# Patient Record
Sex: Female | Born: 1965 | Race: White | Hispanic: No | Smoking: Former smoker
Health system: Southern US, Community
[De-identification: ages and names within clinical notes are randomized; demographics above are authoritative.]

## PROBLEM LIST (undated history)

## (undated) DIAGNOSIS — I10 Essential (primary) hypertension: Secondary | ICD-10-CM

## (undated) DIAGNOSIS — E781 Pure hyperglyceridemia: Secondary | ICD-10-CM

## (undated) DIAGNOSIS — R0602 Shortness of breath: Secondary | ICD-10-CM

## (undated) DIAGNOSIS — J302 Other seasonal allergic rhinitis: Secondary | ICD-10-CM

## (undated) DIAGNOSIS — Z800 Family history of malignant neoplasm of digestive organs: Secondary | ICD-10-CM

## (undated) DIAGNOSIS — Z9851 Tubal ligation status: Secondary | ICD-10-CM

## (undated) DIAGNOSIS — J189 Pneumonia, unspecified organism: Secondary | ICD-10-CM

## (undated) DIAGNOSIS — J45909 Unspecified asthma, uncomplicated: Secondary | ICD-10-CM

## (undated) DIAGNOSIS — J40 Bronchitis, not specified as acute or chronic: Secondary | ICD-10-CM

## (undated) DIAGNOSIS — N959 Unspecified menopausal and perimenopausal disorder: Secondary | ICD-10-CM

## (undated) HISTORY — DX: Family history of malignant neoplasm of digestive organs: Z80.0

## (undated) HISTORY — PX: CHOLECYSTECTOMY: SHX55

## (undated) HISTORY — DX: Shortness of breath: R06.02

## (undated) HISTORY — DX: Pneumonia, unspecified organism: J18.9

## (undated) HISTORY — DX: Bronchitis, not specified as acute or chronic: J40

## (undated) HISTORY — DX: Pure hyperglyceridemia: E78.1

## (undated) HISTORY — PX: TUBAL LIGATION: SHX77

## (undated) HISTORY — DX: Unspecified menopausal and perimenopausal disorder: N95.9

## (undated) HISTORY — DX: Other seasonal allergic rhinitis: J30.2

## (undated) NOTE — *Deleted (*Deleted)
Pt  was called to discuss her lab results.

---
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
## 1999-05-19 ENCOUNTER — Ambulatory Visit (HOSPITAL_COMMUNITY): Admission: RE | Admit: 1999-05-19 | Discharge: 1999-05-19 | Payer: Self-pay | Admitting: *Deleted

## 1999-06-24 ENCOUNTER — Ambulatory Visit (HOSPITAL_COMMUNITY): Admission: RE | Admit: 1999-06-24 | Discharge: 1999-06-24 | Payer: Self-pay | Admitting: *Deleted

## 1999-09-03 ENCOUNTER — Inpatient Hospital Stay (HOSPITAL_COMMUNITY): Admission: AD | Admit: 1999-09-03 | Discharge: 1999-09-03 | Payer: Self-pay | Admitting: Obstetrics

## 1999-09-29 ENCOUNTER — Ambulatory Visit (HOSPITAL_COMMUNITY): Admission: RE | Admit: 1999-09-29 | Discharge: 1999-09-29 | Payer: Self-pay | Admitting: Obstetrics & Gynecology

## 1999-11-11 ENCOUNTER — Encounter (HOSPITAL_COMMUNITY): Admission: RE | Admit: 1999-11-11 | Discharge: 1999-11-23 | Payer: Self-pay | Admitting: *Deleted

## 1999-11-22 ENCOUNTER — Inpatient Hospital Stay (HOSPITAL_COMMUNITY): Admission: AD | Admit: 1999-11-22 | Discharge: 1999-11-26 | Payer: Self-pay | Admitting: *Deleted

## 2001-05-22 ENCOUNTER — Ambulatory Visit (HOSPITAL_COMMUNITY): Admission: RE | Admit: 2001-05-22 | Discharge: 2001-05-22 | Payer: Self-pay | Admitting: *Deleted

## 2001-07-08 ENCOUNTER — Ambulatory Visit (HOSPITAL_COMMUNITY): Admission: RE | Admit: 2001-07-08 | Discharge: 2001-07-08 | Payer: Self-pay | Admitting: *Deleted

## 2001-09-03 ENCOUNTER — Inpatient Hospital Stay (HOSPITAL_COMMUNITY): Admission: AD | Admit: 2001-09-03 | Discharge: 2001-09-03 | Payer: Self-pay | Admitting: *Deleted

## 2001-09-10 ENCOUNTER — Inpatient Hospital Stay (HOSPITAL_COMMUNITY): Admission: AD | Admit: 2001-09-10 | Discharge: 2001-09-10 | Payer: Self-pay | Admitting: *Deleted

## 2001-09-10 ENCOUNTER — Ambulatory Visit (HOSPITAL_COMMUNITY): Admission: RE | Admit: 2001-09-10 | Discharge: 2001-09-10 | Payer: Self-pay | Admitting: *Deleted

## 2001-09-17 ENCOUNTER — Inpatient Hospital Stay (HOSPITAL_COMMUNITY): Admission: AD | Admit: 2001-09-17 | Discharge: 2001-09-17 | Payer: Self-pay | Admitting: *Deleted

## 2001-09-26 ENCOUNTER — Encounter (HOSPITAL_COMMUNITY): Admission: AD | Admit: 2001-09-26 | Discharge: 2001-10-21 | Payer: Self-pay | Admitting: Obstetrics & Gynecology

## 2001-09-29 ENCOUNTER — Inpatient Hospital Stay (HOSPITAL_COMMUNITY): Admission: AD | Admit: 2001-09-29 | Discharge: 2001-09-29 | Payer: Self-pay | Admitting: Obstetrics & Gynecology

## 2001-10-02 ENCOUNTER — Inpatient Hospital Stay (HOSPITAL_COMMUNITY): Admission: AD | Admit: 2001-10-02 | Discharge: 2001-10-02 | Payer: Self-pay | Admitting: Obstetrics

## 2001-10-18 ENCOUNTER — Inpatient Hospital Stay (HOSPITAL_COMMUNITY): Admission: AD | Admit: 2001-10-18 | Discharge: 2001-10-21 | Payer: Self-pay | Admitting: Obstetrics & Gynecology

## 2001-10-18 ENCOUNTER — Encounter (INDEPENDENT_AMBULATORY_CARE_PROVIDER_SITE_OTHER): Payer: Self-pay

## 2005-06-26 ENCOUNTER — Emergency Department (HOSPITAL_COMMUNITY): Admission: EM | Admit: 2005-06-26 | Discharge: 2005-06-27 | Payer: Self-pay | Admitting: Emergency Medicine

## 2005-12-23 ENCOUNTER — Emergency Department (HOSPITAL_COMMUNITY): Admission: EM | Admit: 2005-12-23 | Discharge: 2005-12-23 | Payer: Self-pay | Admitting: Emergency Medicine

## 2006-01-23 ENCOUNTER — Ambulatory Visit (HOSPITAL_COMMUNITY): Admission: RE | Admit: 2006-01-23 | Discharge: 2006-01-23 | Payer: Self-pay | Admitting: Obstetrics

## 2007-12-30 ENCOUNTER — Emergency Department (HOSPITAL_COMMUNITY): Admission: EM | Admit: 2007-12-30 | Discharge: 2007-12-30 | Payer: Self-pay | Admitting: Family Medicine

## 2010-11-26 ENCOUNTER — Emergency Department (HOSPITAL_COMMUNITY)
Admission: EM | Admit: 2010-11-26 | Discharge: 2010-11-26 | Payer: Self-pay | Source: Home / Self Care | Admitting: Emergency Medicine

## 2010-12-17 ENCOUNTER — Encounter: Payer: Self-pay | Admitting: Obstetrics

## 2010-12-18 ENCOUNTER — Encounter: Payer: Self-pay | Admitting: Obstetrics

## 2011-02-06 LAB — COMPREHENSIVE METABOLIC PANEL
ALT: 20 U/L (ref 0–35)
AST: 19 U/L (ref 0–37)
CO2: 26 mEq/L (ref 19–32)
Calcium: 9 mg/dL (ref 8.4–10.5)
Creatinine, Ser: 0.68 mg/dL (ref 0.4–1.2)
GFR calc Af Amer: >60 mL/min (ref >60)
GFR calc non Af Amer: >60 mL/min (ref >60)
Glucose, Bld: 89 mg/dL (ref 70–99)
Sodium: 140 mEq/L (ref 135–145)
Total Protein: 7 g/dL (ref 6.0–8.3)

## 2011-02-06 LAB — LIPASE, BLOOD: Lipase: 24 U/L (ref 11–59)

## 2011-02-06 LAB — URINALYSIS, ROUTINE W REFLEX MICROSCOPIC
Bilirubin Urine: NEGATIVE
Ketones, ur: NEGATIVE mg/dL
Nitrite: NEGATIVE
Protein, ur: NEGATIVE mg/dL

## 2011-02-06 LAB — CBC
Hemoglobin: 13 g/dL (ref 12.0–15.0)
MCH: 29.5 pg (ref 26.0–34.0)
MCHC: 32.4 g/dL (ref 30.0–36.0)
RDW: 13.6 % (ref 11.5–15.5)

## 2011-04-14 NOTE — Op Note (Signed)
[facility]  Patient:    WILHELMENA, ZEA Visit Number: [id number] MRN: [id number]          Service Type: Attending:  Conni Elliot, M.D. Dictated by:   Conni Elliot, M.D. Proc. Date: 10/19/01                             Operative Report  PREOPERATIVE DIAGNOSIS:       _____ sterilization.  POSTOPERATIVE DIAGNOSIS:      _____ sterilization.  OPERATION:                    Modified bilateral Pomeroy bilateral tubal ligation.  SURGEON:                      Conni Elliot, M.D.  ANESTHESIA:                   Epidural.  FINDINGS:                     Tubes and ovaries normal.  _____ .  DESCRIPTION OF PROCEDURE:     The patient was placed _____ , epidural anesthetic.  The patient was supine, left lateral _____ oxygen and was prepped and draped in the sterile fashion.  A periumbilical incision was made and incision made in the skin down to the fascia.  _____ .  The left fallopian tube was identified, grasped with Babcock clamps, followed out to the fimbriated end.  _____ was brought into the operative field, was suture ligated approximately 0.5 to 2 cm segment _____ .  The same procedure was done on the opposite side.  Hemostasis was adequate.  The anterior peritoneum and fascia.  Subcutaneous tissue and skin were closed in the usual fashion. Estimated blood loss was _____ cc.  Needle and sponge counts correct.  Dictated by:   Conni Elliot, M.D. Attending:  Conni Elliot, M.D. DD:  10/19/01 TD:  10/20/01 Job: [id number]

## 2011-04-14 NOTE — Op Note (Signed)
[facility]  Patient:    Kathy Hamilton, Kathy Hamilton Visit Number: [id number] MRN: [id number]          Service Type: ANT Location: [facility] Attending Physician:  Antionette Char Dictated by:   Conni Elliot, M.D. Proc. Date: 10/19/01 Admit Date:  09/26/2001 Discharge Date: 10/21/2001                             Operative Report  PREOPERATIVE DIAGNOSIS:       Desire for surgical sterilization.  POSTOPERATIVE DIAGNOSIS:      Desire for surgical sterilization.  OPERATION:                    Modified bilateral Pomeroy bilateral tubal                               ligation.  SURGEON:                      Conni Elliot, M.D.  ANESTHESIA:                   Epidural/local.  FINDINGS:                     Tubes and ovaries normal post gravid state.  DESCRIPTION OF PROCEDURE:     The patient was placed on the OR table and and underwent epidural anesthetic.  The patient was supine, left lateral tilt position, receiving oxygen and was prepped and draped in a sterile fashion.  A periumbilical incision was made in the skin and deepened down to the fascia.  The peritoneal cavity was entered.  The left fallopian tube was identified, grasped with Babcock clamps, followed out to the fimbriated end. A knuckle of tube was brought up into the operative field, that was suture ligated and an approximate 0.5 to 2 cm segment was excised. Hemostasis was adequate.  The same procedure was done on the opposite side. Hemostasis was adequate.  The anterior peritoneum and fascia, subcutaneous tissue and skin were closed in the usual fashion. Estimated blood loss was less than 10 cc.  Needle and sponge counts correct.  The patient tolerated the procedure well. Dictated by:   Conni Elliot, M.D. Attending Physician:  Antionette Char DD:  10/19/01 TD:  10/20/01 Job: [id number]

## 2011-06-15 ENCOUNTER — Other Ambulatory Visit (HOSPITAL_COMMUNITY): Payer: Self-pay | Admitting: Obstetrics

## 2011-06-15 DIAGNOSIS — Z1231 Encounter for screening mammogram for malignant neoplasm of breast: Secondary | ICD-10-CM

## 2011-07-10 ENCOUNTER — Inpatient Hospital Stay (INDEPENDENT_AMBULATORY_CARE_PROVIDER_SITE_OTHER)
Admission: RE | Admit: 2011-07-10 | Discharge: 2011-07-10 | Disposition: A | Discharge status: Home / Self Care | Payer: 59 | Source: Ambulatory Visit | Attending: Family Medicine | Admitting: Family Medicine

## 2011-07-10 DIAGNOSIS — B029 Zoster without complications: Secondary | ICD-10-CM

## 2011-07-24 ENCOUNTER — Ambulatory Visit (HOSPITAL_COMMUNITY)
Admission: RE | Admit: 2011-07-24 | Discharge: 2011-07-24 | Disposition: A | Discharge status: Home / Self Care | Payer: 59 | Source: Ambulatory Visit | Attending: Obstetrics | Admitting: Obstetrics

## 2011-07-24 DIAGNOSIS — Z1231 Encounter for screening mammogram for malignant neoplasm of breast: Secondary | ICD-10-CM

## 2011-07-25 ENCOUNTER — Ambulatory Visit (HOSPITAL_COMMUNITY): Payer: 59

## 2011-11-28 HISTORY — PX: ORTHOPEDIC SURGERY: SHX850

## 2012-08-20 ENCOUNTER — Emergency Department (HOSPITAL_COMMUNITY)
Admission: EM | Admit: 2012-08-20 | Discharge: 2012-08-20 | Disposition: A | Discharge status: Home / Self Care | Payer: No Typology Code available for payment source | Attending: Emergency Medicine | Admitting: Emergency Medicine

## 2012-08-20 ENCOUNTER — Encounter (HOSPITAL_COMMUNITY): Payer: Self-pay

## 2012-08-20 DIAGNOSIS — Z87891 Personal history of nicotine dependence: Secondary | ICD-10-CM | POA: Insufficient documentation

## 2012-08-20 DIAGNOSIS — Y92410 Unspecified street and highway as the place of occurrence of the external cause: Secondary | ICD-10-CM | POA: Insufficient documentation

## 2012-08-20 DIAGNOSIS — M25519 Pain in unspecified shoulder: Secondary | ICD-10-CM | POA: Insufficient documentation
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
## 2012-08-20 NOTE — ED Provider Notes (Signed)
Medical screening examination/treatment/procedure(s) were performed by non-physician practitioner and as supervising physician I was immediately available for consultation/collaboration.    Lisel Siegrist R [employer], MD 08/20/12 1658 

## 2012-10-11 ENCOUNTER — Emergency Department (HOSPITAL_COMMUNITY): Payer: Self-pay

## 2012-10-11 ENCOUNTER — Emergency Department (HOSPITAL_COMMUNITY)
Admission: EM | Admit: 2012-10-11 | Discharge: 2012-10-12 | Disposition: A | Discharge status: Home / Self Care | Payer: Self-pay | Attending: Emergency Medicine | Admitting: Emergency Medicine

## 2012-10-11 DIAGNOSIS — W268XXA Contact with other sharp object(s), not elsewhere classified, initial encounter: Secondary | ICD-10-CM | POA: Insufficient documentation

## 2012-10-11 DIAGNOSIS — Y939 Activity, unspecified: Secondary | ICD-10-CM | POA: Insufficient documentation

## 2012-10-11 DIAGNOSIS — S90851A Superficial foreign body, right foot, initial encounter: Secondary | ICD-10-CM

## 2012-10-11 DIAGNOSIS — Z87891 Personal history of nicotine dependence: Secondary | ICD-10-CM | POA: Insufficient documentation

## 2012-10-11 DIAGNOSIS — IMO0002 Reserved for concepts with insufficient information to code with codable children: Secondary | ICD-10-CM | POA: Insufficient documentation

## 2012-10-11 DIAGNOSIS — Y929 Unspecified place or not applicable: Secondary | ICD-10-CM | POA: Insufficient documentation

## 2012-10-11 NOTE — ED Notes (Signed)
Pt stepped on a piece of glass with R foot about a month ago. Pt states there is glass on the lateral side of R foot. Pt ambulatory with steady gait at triage.

## 2012-10-11 NOTE — ED Notes (Signed)
Pt returned from xray ambulatory

## 2012-10-12 MED ORDER — HYDROCODONE-ACETAMINOPHEN 5-325 MG PO TABS: 1.0000 | ORAL_TABLET | ORAL | Status: DC | PRN

## 2012-10-12 NOTE — ED Provider Notes (Signed)
History     CSN: [id number]  Arrival date & time 10/11/12  2139   First MD Initiated Contact with Patient 10/11/12 2250      Chief Complaint  Patient presents with  . Foriegn Body to foot     (Consider location/radiation/quality/duration/timing/severity/associated sxs/prior treatment) Patient is a 46 y.o. female presenting with lower extremity pain. The history is provided by the patient.  Foot Pain This is a new problem. The current episode started 1 to 4 weeks ago. The problem occurs constantly. The problem has been unchanged. Pertinent negatives include no fever. Associated symptoms comments: She stepped on a piece of glass 2 weeks ago and has continued pain. No redness, or fever. No significant swelling.Marland Kitchen    No past medical history on file.  Past Surgical History  Procedure Date  . Cholecystectomy     No family history on file.  History  Substance Use Topics  . Smoking status: Former Games developer  . Smokeless tobacco: Not on file  . Alcohol Use: No    OB History    Grav Para Term Preterm Abortions TAB SAB Ect Mult Living                  Review of Systems  Constitutional: Negative for fever.  Musculoskeletal:       See HPI.  Skin: Negative for wound.    Allergies  Review of patient's allergies indicates no known allergies.  Home Medications   Current Outpatient Rx  Name  Route  Sig  Dispense  Refill  . ACETAMINOPHEN 500 MG PO TABS   Oral   Take 500 mg by mouth every 6 (six) hours as needed. For pain         . ADULT MULTIVITAMIN W/MINERALS CH   Oral   Take 1 tablet by mouth daily.         Marland Kitchen PRESCRIPTION MEDICATION   Oral   Take 1-2 tablets by mouth every 8 (eight) hours as needed. Prescription medication taken as needed for muscle pain or spasm           BP 141/91  Pulse 84  Temp 98.7 F (37.1 C) (Oral)  Resp 17  SpO2 98%  Physical Exam  Constitutional: She appears well-developed and well-nourished. No distress.  Musculoskeletal:     Small firm nodule palpable plantar right foot overlying distal 5th MT. No wound, no redness.   Skin: Skin is warm and dry.    ED Course  Procedures (including critical care time)  Labs Reviewed - No data to display Dg Foot Complete Right  10/11/2012  *RADIOLOGY REPORT*  Clinical Data: Foreign body? Stepped on glass.  RIGHT FOOT COMPLETE - 3+ VIEW  Comparison:  None.  Findings:  There is no evidence of fracture or dislocation.  There is no evidence of arthropathy or other focal bone abnormality. Soft tissues are unremarkable.  IMPRESSION: Negative.   Original Report Authenticated By: Davonna Belling, M.D.      No diagnosis found. 1. Foreign body right foot.   MDM  Questionable foreign body in right foot. Refer to surgery.        Rodena Medin, PA-C 10/12/12 0010

## 2012-10-12 NOTE — ED Provider Notes (Signed)
Medical screening examination/treatment/procedure(s) were performed by non-physician practitioner and as supervising physician I was immediately available for consultation/collaboration.  Jashad Depaula T Teryn Boerema, MD 10/12/12 0624 

## 2012-12-02 ENCOUNTER — Ambulatory Visit (INDEPENDENT_AMBULATORY_CARE_PROVIDER_SITE_OTHER): Payer: Managed Care, Other (non HMO) | Admitting: Emergency Medicine

## 2012-12-02 VITALS — BP 151/99 | HR 80 | Temp 98.5°F | Resp 16 | Ht 63.0 in | Wt 189.0 lb

## 2012-12-02 DIAGNOSIS — N949 Unspecified condition associated with female genital organs and menstrual cycle: Secondary | ICD-10-CM

## 2012-12-02 DIAGNOSIS — N938 Other specified abnormal uterine and vaginal bleeding: Secondary | ICD-10-CM

## 2012-12-02 LAB — POCT CBC
HCT, POC: 49.2 % — AB (ref 37.7–47.9)
Lymph, poc: 3.8 — AB (ref 0.6–3.4)
MCH, POC: 29.3 pg (ref 27–31.2)
MCHC: 31.3 g/dL — AB (ref 31.8–35.4)
MCV: 93.7 fL (ref 80–97)
MID (cbc): 0.7 (ref 0–0.9)
POC LYMPH PERCENT: 29 %L (ref 10–50)
Platelet Count, POC: 425 10*3/uL — AB (ref 142–424)
RDW, POC: 13.9 %
WBC: 13 10*3/uL — AB (ref 4.6–10.2)

## 2012-12-02 LAB — POCT UA - MICROSCOPIC ONLY
Casts, Ur, LPF, POC: NEGATIVE
WBC, Ur, HPF, POC: NEGATIVE
Yeast, UA: NEGATIVE

## 2012-12-02 LAB — POCT URINALYSIS DIPSTICK
Leukocytes, UA: NEGATIVE
Nitrite, UA: NEGATIVE
Protein, UA: 30
pH, UA: 5

## 2012-12-02 LAB — POCT URINE PREGNANCY: Preg Test, Ur: NEGATIVE

## 2012-12-02 NOTE — Patient Instructions (Addendum)
Dysfunctional Uterine Bleeding  Normally, menstrual periods begin between ages 11 to 17 in young women. A normal menstrual cycle/period may begin every 23 days up to 35 days and lasts from 1 to 7 days. Around 12 to 14 days before your menstrual period starts, ovulation (ovary produces an egg) occurs. When counting the time between menstrual periods, count from the first day of bleeding of the previous period to the first day of bleeding of the next period.  Dysfunctional (abnormal) uterine bleeding is bleeding that is different from a normal menstrual period. Your periods may come earlier or later than usual. They may be lighter, have blood clots or be heavier. You may have bleeding between periods, or you may skip one period or more. You may have bleeding after sexual intercourse, bleeding after menopause, or no menstrual period.  CAUSES   · Pregnancy (normal, miscarriage, tubal).  · IUDs (intrauterine device, birth control).  · Birth control pills.  · Hormone treatment.  · Menopause.  · Infection of the cervix.  · Blood clotting problems.  · Infection of the inside lining of the uterus.  · Endometriosis, inside lining of the uterus growing in the pelvis and other female organs.  · Adhesions (scar tissue) inside the uterus.  · Obesity or severe weight loss.  · Uterine polyps inside the uterus.  · Cancer of the vagina, cervix, or uterus.  · Ovarian cysts or polycystic ovary syndrome.  · Medical problems (diabetes, thyroid disease).  · Uterine fibroids (noncancerous tumor).  · Problems with your female hormones.  · Endometrial hyperplasia, very thick lining and enlarged cells inside of the uterus.  · Medicines that interfere with ovulation.  · Radiation to the pelvis or abdomen.  · Chemotherapy.  DIAGNOSIS   · Your doctor will discuss the history of your menstrual periods, medicines you are taking, changes in your weight, stress in your life, and any medical problems you may have.  · Your doctor will do a physical  and pelvic examination.  · Your doctor may want to perform certain tests to make a diagnosis, such as:  · Pap test.  · Blood tests.  · Cultures for infection.  · CT scan.  · Ultrasound.  · Hysteroscopy.  · Laparoscopy.  · MRI.  · Hysterosalpingography.  · D and C.  · Endometrial biopsy.  TREATMENT   Treatment will depend on the cause of the dysfunctional uterine bleeding (DUB). Treatment may include:  · Observing your menstrual periods for a couple of months.  · Prescribing medicines for medical problems, including:  · Antibiotics.  · Hormones.  · Birth control pills.  · Removing an IUD (intrauterine device, birth control).  · Surgery:  · D and C (scrape and remove tissue from inside the uterus).  · Laparoscopy (examine inside the abdomen with a lighted tube).  · Uterine ablation (destroy lining of the uterus with electrical current, laser, heat, or freezing).  · Hysteroscopy (examine cervix and uterus with a lighted tube).  · Hysterectomy (remove the uterus).  HOME CARE INSTRUCTIONS   · If medicines were prescribed, take exactly as directed. Do not change or switch medicines without consulting your caregiver.  · Long term heavy bleeding may result in iron deficiency. Your caregiver may have prescribed iron pills. They help replace the iron that your body lost from heavy bleeding. Take exactly as directed.  · Do not take aspirin or medicines that contain aspirin one week before or during your menstrual period. Aspirin may make   the bleeding worse.  · If you need to change your sanitary pad or tampon more than once every 2 hours, stay in bed with your feet elevated and a cold pack on your lower abdomen. Rest as much as possible, until the bleeding stops or slows down.  · Eat well-balanced meals. Eat foods high in iron. Examples are:  · Leafy green vegetables.  · Whole-grain breads and cereals.  · Eggs.  · Meat.  · Liver.  · Do not try to lose weight until the abnormal bleeding has stopped and your blood iron level is  back to normal. Do not lift more than ten pounds or do strenuous activities when you are bleeding.  · For a couple of months, make note on your calendar, marking the start and ending of your period, and the type of bleeding (light, medium, heavy, spotting, clots or missed periods). This is for your caregiver to better evaluate your problem.  SEEK MEDICAL CARE IF:   · You develop nausea (feeling sick to your stomach) and vomiting, dizziness, or diarrhea while you are taking your medicine.  · You are getting lightheaded or weak.  · You have any problems that may be related to the medicine you are taking.  · You develop pain with your DUB.  · You want to remove your IUD.  · You want to stop or change your birth control pills or hormones.  · You have any type of abnormal bleeding mentioned above.  · You are over 16 years old and have not had a menstrual period yet.  · You are 47 years old and you are still having menstrual periods.  · You have any of the symptoms mentioned above.  · You develop a rash.  SEEK IMMEDIATE MEDICAL CARE IF:   · An oral temperature above 102° F (38.9° C) develops.  · You develop chills.  · You are changing your sanitary pad or tampon more than once an hour.  · You develop abdominal pain.  · You pass out or faint.  Document Released: 11/10/2000 Document Revised: 02/05/2012 Document Reviewed: 10/12/2009  Exit[employer]® Patient Information ©2013 [employer].

## 2012-12-02 NOTE — Progress Notes (Signed)
[facility] [address], [place] [address] [phone]  Date:  12/02/2012   Name:  Kathy Hamilton   DOB:  [birth year]   MRN:  [id number]  PCP:  No primary provider on file.    Chief Complaint: Menorrhagia and Gynecologic Exam   History of Present Illness:  Kathy Hamilton is a 47 y.o. very pleasant female patient who presents with the following:  Normal regular menses in October and November.  Had bleeding at usual time in December and stopped 12/22 and resumed the next day when she passed tissue and a large clot.  Continues to bleed daily and uses 5 pads a day.  No history of dysfunctional bleeding.   Premenopausal.  No clotting disorder.  [id number].  There is no problem list on file for this patient.   History reviewed. No pertinent past medical history.  Past Surgical History  Procedure Date  . Cholecystectomy     History  Substance Use Topics  . Smoking status: Former Games developer  . Smokeless tobacco: Not on file  . Alcohol Use: No    Family History  Problem Relation Age of Onset  . Cancer Mother   . Stroke Mother   . Heart attack Mother   . Bipolar disorder Sister   . Stroke Brother   . Asthma Daughter   . Bipolar disorder Daughter   . ADD / ADHD Son   . Asperger's syndrome Son     No Known Allergies  Medication list has been reviewed and updated.  Current Outpatient Prescriptions on File Prior to Visit  Medication Sig Dispense Refill  . Multiple Vitamin (MULTIVITAMIN WITH MINERALS) TABS Take 1 tablet by mouth daily.      Marland Kitchen acetaminophen (TYLENOL) 500 MG tablet Take 500 mg by mouth every 6 (six) hours as needed. For pain      . HYDROcodone-acetaminophen (NORCO/VICODIN) 5-325 MG per tablet Take 1 tablet by mouth every 4 (four) hours as needed for pain.  10 tablet  0  . PRESCRIPTION MEDICATION Take 1-2 tablets by mouth every 8 (eight) hours as needed. Prescription medication taken as needed for muscle pain or spasm        Review of Systems:  As  per HPI, otherwise negative.    Physical Examination: Filed Vitals:   12/02/12 1539  BP: 151/99  Pulse: 80  Temp: 98.5 F (36.9 C)  Resp: 16   Filed Vitals:   12/02/12 1539  Height: 5\' 3"  (1.6 m)  Weight: 189 lb (85.73 kg)   Body mass index is 33.48 kg/(m^2). Ideal Body Weight: Weight in (lb) to have BMI = 25: 140.8   GEN: WDWN, NAD, Non-toxic, A & O x 3 HEENT: Atraumatic, Normocephalic. Neck supple. No masses, No LAD. Ears and Nose: No external deformity. CV: RRR, No M/G/R. No JVD. No thrill. No extra heart sounds. PULM: CTA B, no wheezes, crackles, rhonchi. No retractions. No resp. distress. No accessory muscle use. ABD: S, NT, ND, +BS. No rebound. No HSM. EXTR: No c/c/e NEURO Normal gait.  PSYCH: Normally interactive. Conversant. Not depressed or anxious appearing.  Calm demeanor.   Pelvic - normal external genitalia, vulva, vagina, cervix, uterus and adnexa   Assessment and Plan: Dysfunctional uterine bleeding. GYN consult  Carmelina Dane, MD  Results for orders placed in visit on 12/02/12  POCT URINE PREGNANCY      Component Value Range   Preg Test, Ur Negative    POCT URINALYSIS DIPSTICK  Component Value Range   Color, UA brown     Clarity, UA cloudy     Glucose, UA neg     Bilirubin, UA neg     Ketones, UA neg     Spec Grav, UA >=1.030     Blood, UA large     pH, UA 5.0     Protein, UA 30     Urobilinogen, UA 0.2     Nitrite, UA neg     Leukocytes, UA Negative    POCT UA - MICROSCOPIC ONLY      Component Value Range   WBC, Ur, HPF, POC neg     RBC, urine, microscopic tntc     Bacteria, U Microscopic neg     Mucus, UA neg     Epithelial cells, urine per micros 7-12     Crystals, Ur, HPF, POC neg     Casts, Ur, LPF, POC neg     Yeast, UA neg    POCT CBC      Component Value Range   WBC 13.0 (*) 4.6 - 10.2 K/uL   Lymph, poc 3.8 (*) 0.6 - 3.4   POC LYMPH PERCENT 29.0  10 - 50 %L   MID (cbc) 0.7  0 - 0.9   POC MID % 5.7  0 - 12 %M    POC Granulocyte 8.5 (*) 2 - 6.9   Granulocyte percent 65.3  37 - 80 %G   RBC 5.25  4.04 - 5.48 M/uL   Hemoglobin 15.4  12.2 - 16.2 g/dL   HCT, POC 40.9 (*) 81.1 - 47.9 %   MCV 93.7  80 - 97 fL   MCH, POC 29.3  27 - 31.2 pg   MCHC 31.3 (*) 31.8 - 35.4 g/dL   RDW, POC 91.4     Platelet Count, POC 425 (*) 142 - 424 K/uL   MPV 8.8  0 - 99.8 fL

## 2014-11-19 ENCOUNTER — Emergency Department (HOSPITAL_COMMUNITY)
Admission: EM | Admit: 2014-11-19 | Discharge: 2014-11-19 | Disposition: A | Discharge status: Home / Self Care | Payer: Medicaid Other | Attending: Emergency Medicine | Admitting: Emergency Medicine

## 2014-11-19 ENCOUNTER — Emergency Department (HOSPITAL_COMMUNITY): Payer: Medicaid Other

## 2014-11-19 ENCOUNTER — Encounter (HOSPITAL_COMMUNITY): Payer: Self-pay | Admitting: Emergency Medicine

## 2014-11-19 DIAGNOSIS — R42 Dizziness and giddiness: Secondary | ICD-10-CM | POA: Insufficient documentation

## 2014-11-19 DIAGNOSIS — Z79899 Other long term (current) drug therapy: Secondary | ICD-10-CM | POA: Insufficient documentation

## 2014-11-19 DIAGNOSIS — J159 Unspecified bacterial pneumonia: Secondary | ICD-10-CM | POA: Insufficient documentation

## 2014-11-19 DIAGNOSIS — J189 Pneumonia, unspecified organism: Secondary | ICD-10-CM

## 2014-11-19 DIAGNOSIS — R05 Cough: Secondary | ICD-10-CM | POA: Diagnosis present

## 2014-11-19 DIAGNOSIS — Z87891 Personal history of nicotine dependence: Secondary | ICD-10-CM | POA: Diagnosis not present

## 2014-11-19 LAB — RAPID STREP SCREEN (MED CTR MEBANE ONLY): STREPTOCOCCUS, GROUP A SCREEN (DIRECT): NEGATIVE

## 2014-11-19 MED ORDER — HYDROCODONE-HOMATROPINE 5-1.5 MG/5ML PO SYRP: 5.0000 mL | ORAL_SOLUTION | Freq: Four times a day (QID) | ORAL | Status: DC | PRN

## 2014-11-19 MED ORDER — AZITHROMYCIN 250 MG PO TABS: ORAL_TABLET | ORAL | Status: DC

## 2014-11-19 MED ORDER — ALBUTEROL SULFATE HFA 108 (90 BASE) MCG/ACT IN AERS
1.0000 | INHALATION_SPRAY | RESPIRATORY_TRACT | Status: DC
Start: 2014-11-19 — End: 2014-11-19
  Administered 2014-11-19: 1 via RESPIRATORY_TRACT
  Filled 2014-11-19: qty 6.7

## 2014-11-19 NOTE — ED Notes (Signed)
Per pt, states cold symptoms for over three days-OTC meds not working

## 2014-11-19 NOTE — ED Provider Notes (Signed)
CSN: [id number]     Arrival date & time 11/19/14  1200 History   First MD Initiated Contact with Patient 11/19/14 1231    This chart was scribed for non-physician practitioner, Arthor CaptainAbigail Aissa Lisowski, working with Richardean Canalavid H Yao, MD by Marica OtterNusrat Rahman, ED Scribe. This patient was seen in room WTR5/WTR5 and the patient's care was started at 12:34 PM.  Chief Complaint  Patient presents with  . URI   HPI  PCP: No primary care provider on file. HPI Comments: Kathy Hamilton is a 48 y.o. female who presents to the Emergency Department complaining of cold like Sx including acute, intermittent cough, elevated BP, wheezing, sleep disturbances (due to cough), and dizziness onset three days ago. Pt reports taking DayQuil, Alkaseltzer and Advil with minimal relief. Pt denies fever; tobacco use; or Hx of asthma.    History reviewed. No pertinent past medical history. Past Surgical History  Procedure Laterality Date  . Cholecystectomy     Family History  Problem Relation Age of Onset  . Cancer Mother   . Stroke Mother   . Heart attack Mother   . Bipolar disorder Sister   . Stroke Brother   . Asthma Daughter   . Bipolar disorder Daughter   . ADD / ADHD Son   . Asperger's syndrome Son    History  Substance Use Topics  . Smoking status: Former Games developermoker  . Smokeless tobacco: Not on file  . Alcohol Use: No   OB History    No data available     Review of Systems  Constitutional: Negative for fever and chills.  Respiratory: Positive for cough and wheezing.   Neurological: Positive for dizziness.  Psychiatric/Behavioral: Negative for confusion.  All other systems reviewed and are negative.  Allergies  Review of patient's allergies indicates no known allergies.  Home Medications   Prior to Admission medications   Medication Sig Start Date End Date Taking? Authorizing Provider  acetaminophen (TYLENOL) 500 MG tablet Take 500 mg by mouth every 6 (six) hours as needed. For pain    Historical Provider, MD   HYDROcodone-acetaminophen (NORCO/VICODIN) 5-325 MG per tablet Take 1 tablet by mouth every 4 (four) hours as needed for pain. 10/12/12   Shari A Upstill, PA-C  Multiple Vitamin (MULTIVITAMIN WITH MINERALS) TABS Take 1 tablet by mouth daily.    Historical Provider, MD  PRESCRIPTION MEDICATION Take 1-2 tablets by mouth every 8 (eight) hours as needed. Prescription medication taken as needed for muscle pain or spasm    Historical Provider, MD   Triage Vitals: BP 153/66 mmHg  Pulse 35  Temp(Src) 99 F (37.2 C) (Oral)  Resp 18  SpO2 98%  LMP 11/04/2014  Physical Exam Appears moderately ill but not toxic; temperature as noted in vitals. Ears normal. Eyes:glassy appearance, no discharge  Heart: RRR, NO M/G/R Throat and pharynx normal.   Neck supple. No adenopathyhy in the neck.  Sinuses non tender.  The chest is clear. Abdomen is soft and nontender  ED Course  Procedures (including critical care time) DIAGNOSTIC STUDIES: Oxygen Saturation is 98% on RA, nl by my interpretation.    COORDINATION OF CARE: 12:37 PM-Discussed treatment plan which includes imaging, labs, and meds (inhaler) with pt at bedside and pt agreed to plan.   Labs Review Labs Reviewed  RAPID STREP SCREEN  CULTURE, GROUP A STREP    Imaging Review No results found.   EKG Interpretation None      MDM   Final diagnoses:  CAP (community acquired  pneumonia)    Patient with dx of CAP on cxr. Albuterol, supportive treatment, and zpack. Low grade temp elevation, normal 02 sats. No active wheezing. Low risk and ok for d.c.  I personally performed the services described in this documentation, which was scribed in my presence. The recorded information has been reviewed and is accurate.       Filed Vitals:   11/19/14 1206  BP: 153/66  Pulse: 35  Temp: 99 F (37.2 C)  Resp: [address]     [name], PA-C 11/20/14 0723  Richardean Canalavid H Yao, MD 11/20/14 [phone]

## 2014-11-19 NOTE — Discharge Instructions (Signed)

## 2014-11-21 LAB — CULTURE, GROUP A STREP

## 2015-01-29 ENCOUNTER — Emergency Department (HOSPITAL_COMMUNITY)
Admission: EM | Admit: 2015-01-29 | Discharge: 2015-01-29 | Disposition: A | Discharge status: Home / Self Care | Payer: 59 | Source: Home / Self Care | Attending: Family Medicine | Admitting: Family Medicine

## 2015-01-29 ENCOUNTER — Encounter (HOSPITAL_COMMUNITY): Payer: Self-pay | Admitting: *Deleted

## 2015-01-29 DIAGNOSIS — J069 Acute upper respiratory infection, unspecified: Secondary | ICD-10-CM | POA: Diagnosis not present

## 2015-01-29 LAB — POCT RAPID STREP A: Streptococcus, Group A Screen (Direct): NEGATIVE

## 2015-01-29 MED ORDER — IPRATROPIUM BROMIDE 0.06 % NA SOLN: 2.0000 | Freq: Four times a day (QID) | NASAL | Status: DC

## 2015-01-29 NOTE — ED Provider Notes (Signed)
CSN: [id number]     Arrival date & time 01/29/15  1127 History   First MD Initiated Contact with Patient 01/29/15 1321     Chief Complaint  Patient presents with  . Sore Throat   (Consider location/radiation/quality/duration/timing/severity/associated sxs/prior Treatment) Patient is a 49 y.o. female presenting with pharyngitis.  Sore Throat This is a new problem. The current episode started more than 2 days ago. The problem has been gradually worsening. Pertinent negatives include no chest pain, no abdominal pain, no headaches and no shortness of breath. The symptoms are aggravated by swallowing.    History reviewed. No pertinent past medical history. Past Surgical History  Procedure Laterality Date  . Cholecystectomy     Family History  Problem Relation Age of Onset  . Cancer Mother   . Stroke Mother   . Heart attack Mother   . Bipolar disorder Sister   . Stroke Brother   . Asthma Daughter   . Bipolar disorder Daughter   . ADD / ADHD Son   . Asperger's syndrome Son    History  Substance Use Topics  . Smoking status: Former Games developer  . Smokeless tobacco: Not on file  . Alcohol Use: No   OB History    No data available     Review of Systems  Constitutional: Negative for fever and chills.  HENT: Positive for congestion, postnasal drip, rhinorrhea and sore throat.   Respiratory: Negative for cough, shortness of breath and wheezing.   Cardiovascular: Negative.  Negative for chest pain.  Gastrointestinal: Negative.  Negative for abdominal pain.  Genitourinary: Negative.   Neurological: Negative for headaches.    Allergies  Oxycodone  Home Medications   Prior to Admission medications   Medication Sig Start Date End Date Taking? Authorizing Provider  acetaminophen (TYLENOL) 500 MG tablet Take 500 mg by mouth every 6 (six) hours as needed. For pain    Historical Provider, MD  azithromycin (ZITHROMAX Z-PAK) 250 MG tablet 2 po day one, then 1 daily x 4 days 11/19/14    Arthor Captain, PA-C  HYDROcodone-acetaminophen (NORCO/VICODIN) 5-325 MG per tablet Take 1 tablet by mouth every 4 (four) hours as needed for pain. 10/12/12   Shari A Upstill, PA-C  HYDROcodone-homatropine (HYCODAN) 5-1.5 MG/5ML syrup Take 5 mLs by mouth every 6 (six) hours as needed for cough. 11/19/14   Arthor Captain, PA-C  ipratropium (ATROVENT) 0.06 % nasal spray Place 2 sprays into both nostrils 4 (four) times daily. 01/29/15   Linna Hoff, MD  Multiple Vitamin (MULTIVITAMIN WITH MINERALS) TABS Take 1 tablet by mouth daily.    Historical Provider, MD  PRESCRIPTION MEDICATION Take 1-2 tablets by mouth every 8 (eight) hours as needed. Prescription medication taken as needed for muscle pain or spasm    Historical Provider, MD   BP 132/87 mmHg  Pulse 75  Temp(Src) 98.1 F (36.7 C) (Oral)  Resp 16  SpO2 98%  LMP 01/08/2015 Physical Exam  Constitutional: She is oriented to person, place, and time. She appears well-developed and well-nourished. No distress.  HENT:  Head: Normocephalic.  Right Ear: External ear normal.  Left Ear: External ear normal.  Mouth/Throat: Oropharynx is clear and moist.  Eyes: Conjunctivae are normal. Pupils are equal, round, and reactive to light.  Neck: Normal range of motion. Neck supple.  Cardiovascular: Regular rhythm, normal heart sounds and intact distal pulses.   Pulmonary/Chest: Effort normal and breath sounds normal.  Lymphadenopathy:    She has cervical adenopathy.  Neurological: She is alert  and oriented to person, place, and time.  Skin: Skin is warm and dry.  Nursing note and vitals reviewed.   ED Course  Procedures (including critical care time) Labs Review Labs Reviewed  POCT RAPID STREP A ([facility]ARE ONLY)   Strep neg. Imaging Review No results found.   MDM   1. URI (upper respiratory infection)        Linna HoffJames D Slevin Gunby, MD 01/29/15 1410

## 2015-01-29 NOTE — Discharge Instructions (Signed)
Drink plenty of fluids as discussed, use medicine as prescribed, and mucinex or delsym for cough. Return or see your doctor if further problems °

## 2015-01-29 NOTE — ED Notes (Signed)
Pt  Reports      Symptoms     Of sorethroat  With  Pain  When  She  Swallows       Symptoms  X  2  Days

## 2015-02-01 LAB — CULTURE, GROUP A STREP: Strep A Culture: NEGATIVE

## 2015-02-11 ENCOUNTER — Ambulatory Visit: Payer: 59 | Admitting: Family Medicine

## 2015-03-04 ENCOUNTER — Encounter: Payer: Self-pay | Admitting: Family Medicine

## 2015-03-04 ENCOUNTER — Ambulatory Visit (INDEPENDENT_AMBULATORY_CARE_PROVIDER_SITE_OTHER): Payer: Medicaid Other | Admitting: Family Medicine

## 2015-03-04 VITALS — BP 134/74 | HR 79 | Temp 98.2°F | Resp 16 | Ht 63.0 in | Wt 190.0 lb

## 2015-03-04 DIAGNOSIS — Z23 Encounter for immunization: Secondary | ICD-10-CM

## 2015-03-04 DIAGNOSIS — Z0000 Encounter for general adult medical examination without abnormal findings: Secondary | ICD-10-CM

## 2015-03-04 LAB — COMPLETE METABOLIC PANEL WITH GFR
ALBUMIN: 4.4 g/dL (ref 3.5–5.2)
ALT: 17 U/L (ref 0–35)
AST: 17 U/L (ref 0–37)
Alkaline Phosphatase: 70 U/L (ref 39–117)
BUN: 17 mg/dL (ref 6–23)
CALCIUM: 9.2 mg/dL (ref 8.4–10.5)
CHLORIDE: 105 meq/L (ref 96–112)
CO2: 23 meq/L (ref 19–32)
CREATININE: 0.62 mg/dL (ref 0.50–1.10)
GFR, Est African American: >89 mL/min
GFR, Est Non African American: >89 mL/min
Glucose, Bld: 99 mg/dL (ref 70–99)
POTASSIUM: 4.1 meq/L (ref 3.5–5.3)
Sodium: 138 mEq/L (ref 135–145)
Total Bilirubin: 0.7 mg/dL (ref 0.2–1.2)
Total Protein: 7.4 g/dL (ref 6.0–8.3)

## 2015-03-04 LAB — CBC WITH DIFFERENTIAL/PLATELET
Basophils Absolute: 0 10*3/uL (ref 0.0–0.1)
Basophils Relative: 0 % (ref 0–1)
EOS PCT: 3 % (ref 0–5)
Eosinophils Absolute: 0.3 10*3/uL (ref 0.0–0.7)
HEMATOCRIT: 43.3 % (ref 36.0–46.0)
HEMOGLOBIN: 14.1 g/dL (ref 12.0–15.0)
LYMPHS ABS: 3.1 10*3/uL (ref 0.7–4.0)
LYMPHS PCT: 34 % (ref 12–46)
MCH: 29.1 pg (ref 26.0–34.0)
MCHC: 32.6 g/dL (ref 30.0–36.0)
MCV: 89.3 fL (ref 78.0–100.0)
MONO ABS: 0.5 10*3/uL (ref 0.1–1.0)
MONOS PCT: 6 % (ref 3–12)
MPV: 10.1 fL (ref 8.6–12.4)
Neutro Abs: 5.1 10*3/uL (ref 1.7–7.7)
Neutrophils Relative %: 57 % (ref 43–77)
Platelets: 367 10*3/uL (ref 150–400)
RBC: 4.85 MIL/uL (ref 3.87–5.11)
RDW: 14 % (ref 11.5–15.5)
WBC: 9 10*3/uL (ref 4.0–10.5)

## 2015-03-04 LAB — POCT URINALYSIS DIP (DEVICE)
BILIRUBIN URINE: NEGATIVE
GLUCOSE, UA: NEGATIVE mg/dL
KETONES UR: NEGATIVE mg/dL
Leukocytes, UA: NEGATIVE
NITRITE: NEGATIVE
Protein, ur: NEGATIVE mg/dL
Urobilinogen, UA: 0.2 mg/dL (ref 0.0–1.0)
pH: 5 (ref 5.0–8.0)

## 2015-03-04 LAB — TSH: TSH: 2.04 u[IU]/mL (ref 0.350–4.500)

## 2015-03-04 NOTE — Patient Instructions (Signed)
Health Maintenance Adopting a healthy lifestyle and getting preventive care can go a long way to promote health and wellness. Talk with your health care provider about what schedule of regular examinations is right for you. This is a good chance for you to check in with your provider about disease prevention and staying healthy. In between checkups, there are plenty of things you can do on your own. Experts have done a lot of research about which lifestyle changes and preventive measures are most likely to keep you healthy. Ask your health care provider for more information. WEIGHT AND DIET  Eat a healthy diet  Be sure to include plenty of vegetables, fruits, low-fat dairy products, and lean protein.  Do not eat a lot of foods high in solid fats, added sugars, or salt.  Get regular exercise. This is one of the most important things you can do for your health.  Most adults should exercise for at least 150 minutes each week. The exercise should increase your heart rate and make you sweat (moderate-intensity exercise).  Most adults should also do strengthening exercises at least twice a week. This is in addition to the moderate-intensity exercise.  Maintain a healthy weight  Body mass index (BMI) is a measurement that can be used to identify possible weight problems. It estimates body fat based on height and weight. Your health care provider can help determine your BMI and help you achieve or maintain a healthy weight.  For females 25 years of age and older:   A BMI below 18.5 is considered underweight.  A BMI of 18.5 to 24.9 is normal.  A BMI of 25 to 29.9 is considered overweight.  A BMI of 30 and above is considered obese.  Watch levels of cholesterol and blood lipids  You should start having your blood tested for lipids and cholesterol at 49 years of age, then have this test every 5 years.  You may need to have your cholesterol levels checked more often if:  Your lipid or  cholesterol levels are high.  You are older than 49 years of age.  You are at high risk for heart disease.  CANCER SCREENING   Lung Cancer  Lung cancer screening is recommended for adults 49-44 years old who are at high risk for lung cancer because of a history of smoking.  A yearly low-dose CT scan of the lungs is recommended for people who:  Currently smoke.  Have quit within the past 15 years.  Have at least a 30-pack-year history of smoking. A pack year is smoking an average of one pack of cigarettes a day for 1 year.  Yearly screening should continue until it has been 15 years since you quit.  Yearly screening should stop if you develop a health problem that would prevent you from having lung cancer treatment.  Breast Cancer  Practice breast self-awareness. This means understanding how your breasts normally appear and feel.  It also means doing regular breast self-exams. Let your health care provider know about any changes, no matter how small.  If you are in your 49s, you should have a clinical breast exam (CBE) by a health care provider every 1-3 years as part of a regular health exam.  If you are 49 or older, have a CBE every year. Also consider having a breast X-ray (mammogram) every year.  If you have a family history of breast cancer, talk to your health care provider about genetic screening.  If you are  at high risk for breast cancer, talk to your health care provider about having an MRI and a mammogram every year.  Breast cancer gene (BRCA) assessment is recommended for women who have family members with BRCA-related cancers. BRCA-related cancers include:  Breast.  Ovarian.  Tubal.  Peritoneal cancers.  Results of the assessment will determine the need for genetic counseling and BRCA1 and BRCA2 testing. Cervical Cancer Routine pelvic examinations to screen for cervical cancer are no longer recommended for nonpregnant women who are considered low  risk for cancer of the pelvic organs (ovaries, uterus, and vagina) and who do not have symptoms. A pelvic examination may be necessary if you have symptoms including those associated with pelvic infections. Ask your health care provider if a screening pelvic exam is right for you.   The Pap test is the screening test for cervical cancer for women who are considered at risk.  If you had a hysterectomy for a problem that was not cancer or a condition that could lead to cancer, then you no longer need Pap tests.  If you are older than 49 years, and you have had normal Pap tests for the past 10 years, you no longer need to have Pap tests.  If you have had past treatment for cervical cancer or a condition that could lead to cancer, you need Pap tests and screening for cancer for at least 20 years after your treatment.  If you no longer get a Pap test, assess your risk factors if they change (such as having a new sexual partner). This can affect whether you should start being screened again.  Some women have medical problems that increase their chance of getting cervical cancer. If this is the case for you, your health care provider may recommend more frequent screening and Pap tests.  The human papillomavirus (HPV) test is another test that may be used for cervical cancer screening. The HPV test looks for the virus that can cause cell changes in the cervix. The cells collected during the Pap test can be tested for HPV.  The HPV test can be used to screen women 30 years of age and older. Getting tested for HPV can extend the interval between normal Pap tests from three to five years.  An HPV test also should be used to screen women of any age who have unclear Pap test results.  After 49 years of age, women should have HPV testing as often as Pap tests.  Colorectal Cancer  This type of cancer can be detected and often prevented.  Routine colorectal cancer screening usually begins at 50 years of  age and continues through 49 years of age.  Your health care provider may recommend screening at an earlier age if you have risk factors for colon cancer.  Your health care provider may also recommend using home test kits to check for hidden blood in the stool.  A small camera at the end of a tube can be used to examine your colon directly (sigmoidoscopy or colonoscopy). This is done to check for the earliest forms of colorectal cancer.  Routine screening usually begins at age 50.  Direct examination of the colon should be repeated every 5-10 years through 49 years of age. However, you may need to be screened more often if early forms of precancerous polyps or small growths are found. Skin Cancer  Check your skin from head to toe regularly.  Tell your health care provider about any new moles or changes in   moles, especially if there is a change in a mole's shape or color.  Also tell your health care provider if you have a mole that is larger than the size of a pencil eraser.  Always use sunscreen. Apply sunscreen liberally and repeatedly throughout the day.  Protect yourself by wearing long sleeves, pants, a wide-brimmed hat, and sunglasses whenever you are outside. HEART DISEASE, DIABETES, AND HIGH BLOOD PRESSURE   Have your blood pressure checked at least every 1-2 years. High blood pressure causes heart disease and increases the risk of stroke.  If you are between 75 years and 42 years old, ask your health care provider if you should take aspirin to prevent strokes.  Have regular diabetes screenings. This involves taking a blood sample to check your fasting blood sugar level.  If you are at a normal weight and have a low risk for diabetes, have this test once every three years after 49 years of age.  If you are overweight and have a high risk for diabetes, consider being tested at a younger age or more often. PREVENTING INFECTION  Hepatitis B  If you have a higher risk for  hepatitis B, you should be screened for this virus. You are considered at high risk for hepatitis B if:  You were born in a country where hepatitis B is common. Ask your health care provider which countries are considered high risk.  Your parents were born in a high-risk country, and you have not been immunized against hepatitis B (hepatitis B vaccine).  You have HIV or AIDS.  You use needles to inject street drugs.  You live with someone who has hepatitis B.  You have had sex with someone who has hepatitis B.  You get hemodialysis treatment.  You take certain medicines for conditions, including cancer, organ transplantation, and autoimmune conditions. Hepatitis C  Blood testing is recommended for:  Everyone born from [birth year] through 1965.  Anyone with known risk factors for hepatitis C. Sexually transmitted infections (STIs)  You should be screened for sexually transmitted infections (STIs) including gonorrhea and chlamydia if:  You are sexually active and are younger than 48 years of age.  You are older than 49 years of age and your health care provider tells you that you are at risk for this type of infection.  Your sexual activity has changed since you were last screened and you are at an increased risk for chlamydia or gonorrhea. Ask your health care provider if you are at risk.  If you do not have HIV, but are at risk, it may be recommended that you take a prescription medicine daily to prevent HIV infection. This is called pre-exposure prophylaxis (PrEP). You are considered at risk if:  You are sexually active and do not regularly use condoms or know the HIV status of your partner(s).  You take drugs by injection.  You are sexually active with a partner who has HIV. Talk with your health care provider about whether you are at high risk of being infected with HIV. If you choose to begin PrEP, you should first be tested for HIV. You should then be tested every 3 months for  as long as you are taking PrEP.  PREGNANCY   If you are premenopausal and you may become pregnant, ask your health care provider about preconception counseling.  If you may become pregnant, take 400 to 800 micrograms (mcg) of folic acid every day.  If you want to prevent pregnancy, talk to your  partner who has HIV.  Talk with your health care provider about whether you are at high risk of being infected with HIV. If you choose to begin PrEP, you should first be tested for HIV. You should then be tested every 3 months for  as long as you are taking PrEP.   PREGNANCY   · If you are premenopausal and you may become pregnant, ask your health care provider about preconception counseling.  · If you may become pregnant, take 400 to 800 micrograms (mcg) of folic acid every day.  · If you want to prevent pregnancy, talk to your health care provider about birth control (contraception).  OSTEOPOROSIS AND MENOPAUSE   · Osteoporosis is a disease in which the bones lose minerals and strength with aging. This can result in serious bone fractures. Your risk for osteoporosis can be identified using a bone density scan.  · If you are 65 years of age or older, or if you are at risk for osteoporosis and fractures, ask your health care provider if you should be screened.  · Ask your health care provider whether you should take a calcium or vitamin D supplement to lower your risk for osteoporosis.  · Menopause may have certain physical symptoms and risks.  · Hormone replacement therapy may reduce some of these symptoms and risks.  Talk to your health care provider about whether hormone replacement therapy is right for you.   HOME CARE INSTRUCTIONS   · Schedule regular health, dental, and eye exams.  · Stay current with your immunizations.    · Do not use any tobacco products including cigarettes, chewing tobacco, or electronic cigarettes.  · If you are pregnant, do not drink alcohol.  · If you are breastfeeding, limit how much and how often you drink alcohol.  · Limit alcohol intake to no more than 1 drink per day for nonpregnant women. One drink equals 12 ounces of beer, 5 ounces of wine, or 1½ ounces of hard liquor.  · Do not use street drugs.  · Do not share needles.  · Ask your health care provider for help if you need support or information about quitting drugs.  · Tell your health care provider if you often feel depressed.  · Tell your health care provider if you have ever been abused or do not feel safe at home.  Document Released: 05/29/2011  Document Revised: 03/30/2014 Document Reviewed: 10/15/2013  [employer]® Patient Information ©2015 [employer]. This information is not intended to replace advice given to you by your health care provider. Make sure you discuss any questions you have with your health care provider.  Preventive Care for Adults  A healthy lifestyle and preventive care can promote health and wellness. Preventive health guidelines for women include the following key practices.  · A routine yearly physical is a good way to check with your health care provider about your health and preventive screening. It is a chance to share any concerns and updates on your health and to receive a thorough exam.  · Visit your dentist for a routine exam and preventive care every 6 months. Brush your teeth twice a day and floss once a day. Good oral hygiene prevents tooth decay and gum disease.  · The frequency of eye exams is based on your age, health, family medical history, use of contact lenses, and other factors. Follow your health care provider's recommendations for frequency of eye exams.  · Eat a healthy diet. Foods like vegetables, fruits, whole   grains, low-fat dairy products, and lean protein foods contain the nutrients you need without too many calories. Decrease your intake of foods high in solid fats, added sugars, and salt. Eat the right amount of calories for you. Get information about a proper diet from your health care provider, if necessary.  · Regular physical exercise is one of the most important things you can do for your health. Most adults should get at least 150 minutes of moderate-intensity exercise (any activity that increases your heart rate and causes you to sweat) each week. In addition, most adults need muscle-strengthening exercises on 2 or more days a week.  · Maintain a healthy weight. The body mass index (BMI) is a screening tool to identify possible weight problems. It provides an estimate of body fat based on height and  weight. Your health care provider can find your BMI and can help you achieve or maintain a healthy weight. For adults 20 years and older:  ¨ A BMI below 18.5 is considered underweight.  ¨ A BMI of 18.5 to 24.9 is normal.  ¨ A BMI of 25 to 29.9 is considered overweight.  ¨ A BMI of 30 and above is considered obese.  · Maintain normal blood lipids and cholesterol levels by exercising and minimizing your intake of saturated fat. Eat a balanced diet with plenty of fruit and vegetables. Blood tests for lipids and cholesterol should begin at age 20 and be repeated every 5 years. If your lipid or cholesterol levels are high, you are over 50, or you are at high risk for heart disease, you may need your cholesterol levels checked more frequently. Ongoing high lipid and cholesterol levels should be treated with medicines if diet and exercise are not working.  · If you smoke, find out from your health care provider how to quit. If you do not use tobacco, do not start.  · Lung cancer screening is recommended for adults aged 55-80 years who are at high risk for developing lung cancer because of a history of smoking. A yearly low-dose CT scan of the lungs is recommended for people who have at least a 30-pack-year history of smoking and are a current smoker or have quit within the past 15 years. A pack year of smoking is smoking an average of 1 pack of cigarettes a day for 1 year (for example: 1 pack a day for 30 years or 2 packs a day for 15 years). Yearly screening should continue until the smoker has stopped smoking for at least 15 years. Yearly screening should be stopped for people who develop a health problem that would prevent them from having lung cancer treatment.  · If you are pregnant, do not drink alcohol. If you are breastfeeding, be very cautious about drinking alcohol. If you are not pregnant and choose to drink alcohol, do not have more than 1 drink per day. One drink is considered to be 12 ounces (355 mL) of beer,  5 ounces (148 mL) of wine, or 1.5 ounces (44 mL) of liquor.  · Avoid use of street drugs. Do not share needles with anyone. Ask for help if you need support or instructions about stopping the use of drugs.  · High blood pressure causes heart disease and increases the risk of stroke. Your blood pressure should be checked at least every 1 to 2 years. Ongoing high blood pressure should be treated with medicines if weight loss and exercise do not work.  · If you are 55-79 years old, ask your   health care provider if you should take aspirin to prevent strokes.  · Diabetes screening involves taking a blood sample to check your fasting blood sugar level. This should be done once every 3 years, after age 45, if you are within normal weight and without risk factors for diabetes. Testing should be considered at a younger age or be carried out more frequently if you are overweight and have at least 1 risk factor for diabetes.  · Breast cancer screening is essential preventive care for women. You should practice "breast self-awareness." This means understanding the normal appearance and feel of your breasts and may include breast self-examination. Any changes detected, no matter how small, should be reported to a health care provider. Women in their 20s and 30s should have a clinical breast exam (CBE) by a health care provider as part of a regular health exam every 1 to 3 years. After age 40, women should have a CBE every year. Starting at age 40, women should consider having a mammogram (breast X-ray test) every year. Women who have a family history of breast cancer should talk to their health care provider about genetic screening. Women at a high risk of breast cancer should talk to their health care providers about having an MRI and a mammogram every year.  · Breast cancer gene (BRCA)-related cancer risk assessment is recommended for women who have family members with BRCA-related cancers. BRCA-related cancers include breast,  ovarian, tubal, and peritoneal cancers. Having family members with these cancers may be associated with an increased risk for harmful changes (mutations) in the breast cancer genes BRCA1 and BRCA2. Results of the assessment will determine the need for genetic counseling and BRCA1 and BRCA2 testing.  · Routine pelvic exams to screen for cancer are no longer recommended for nonpregnant women who are considered low risk for cancer of the pelvic organs (ovaries, uterus, and vagina) and who do not have symptoms. Ask your health care provider if a screening pelvic exam is right for you.  · If you have had past treatment for cervical cancer or a condition that could lead to cancer, you need Pap tests and screening for cancer for at least 20 years after your treatment. If Pap tests have been discontinued, your risk factors (such as having a new sexual partner) need to be reassessed to determine if screening should be resumed. Some women have medical problems that increase the chance of getting cervical cancer. In these cases, your health care provider may recommend more frequent screening and Pap tests.  · The HPV test is an additional test that may be used for cervical cancer screening. The HPV test looks for the virus that can cause the cell changes on the cervix. The cells collected during the Pap test can be tested for HPV. The HPV test could be used to screen women aged 30 years and older, and should be used in women of any age who have unclear Pap test results. After the age of 30, women should have HPV testing at the same frequency as a Pap test.  · Colorectal cancer can be detected and often prevented. Most routine colorectal cancer screening begins at the age of 50 years and continues through age 75 years. However, your health care provider may recommend screening at an earlier age if you have risk factors for colon cancer. On a yearly basis, your health care provider may provide home test kits to check for hidden  blood in the stool. Use of a small   camera at the end of a tube, to directly examine the colon (sigmoidoscopy or colonoscopy), can detect the earliest forms of colorectal cancer. Talk to your health care provider about this at age 50, when routine screening begins.  Direct exam of the colon should be repeated every 5-10 years through age 75 years, unless early forms of pre-cancerous polyps or small growths are found.  · People who are at an increased risk for hepatitis B should be screened for this virus. You are considered at high risk for hepatitis B if:  ¨ You were born in a country where hepatitis B occurs often. Talk with your health care provider about which countries are considered high risk.  ¨ Your parents were born in a high-risk country and you have not received a shot to protect against hepatitis B (hepatitis B vaccine).  ¨ You have HIV or AIDS.  ¨ You use needles to inject street drugs.  ¨ You live with, or have sex with, someone who has hepatitis B.  ¨ You get hemodialysis treatment.  ¨ You take certain medicines for conditions like cancer, organ transplantation, and autoimmune conditions.  · Hepatitis C blood testing is recommended for all people born from 1945 through 1965 and any individual with known risks for hepatitis C.  · Practice safe sex. Use condoms and avoid high-risk sexual practices to reduce the spread of sexually transmitted infections (STIs). STIs include gonorrhea, chlamydia, syphilis, trichomonas, herpes, HPV, and human immunodeficiency virus (HIV). Herpes, HIV, and HPV are viral illnesses that have no cure. They can result in disability, cancer, and death.  · You should be screened for sexually transmitted illnesses (STIs) including gonorrhea and chlamydia if:  ¨ You are sexually active and are younger than 24 years.  ¨ You are older than 24 years and your health care provider tells you that you are at risk for this type of infection.  ¨ Your sexual activity has changed since you  were last screened and you are at an increased risk for chlamydia or gonorrhea. Ask your health care provider if you are at risk.  · If you are at risk of being infected with HIV, it is recommended that you take a prescription medicine daily to prevent HIV infection. This is called preexposure prophylaxis (PrEP). You are considered at risk if:  ¨ You are a heterosexual woman, are sexually active, and are at increased risk for HIV infection.  ¨ You take drugs by injection.  ¨ You are sexually active with a partner who has HIV.  ¨ Talk with your health care provider about whether you are at high risk of being infected with HIV. If you choose to begin PrEP, you should first be tested for HIV. You should then be tested every 3 months for as long as you are taking PrEP.  · Osteoporosis is a disease in which the bones lose minerals and strength with aging. This can result in serious bone fractures or breaks. The risk of osteoporosis can be identified using a bone density scan. Women ages 65 years and over and women at risk for fractures or osteoporosis should discuss screening with their health care providers. Ask your health care provider whether you should take a calcium supplement or vitamin D to reduce the rate of osteoporosis.  · Menopause can be associated with physical symptoms and risks. Hormone replacement therapy is available to decrease symptoms and risks. You should talk to your health care provider about whether hormone replacement therapy is right for   you.  · Use sunscreen. Apply sunscreen liberally and repeatedly throughout the day. You should seek shade when your shadow is shorter than you. Protect yourself by wearing long sleeves, pants, a wide-brimmed hat, and sunglasses year round, whenever you are outdoors.  · Once a month, do a whole body skin exam, using a mirror to look at the skin on your back. Tell your health care provider of new moles, moles that have irregular borders, moles that are larger  than a pencil eraser, or moles that have changed in shape or color.  · Stay current with required vaccines (immunizations).  ¨ Influenza vaccine. All adults should be immunized every year.  ¨ Tetanus, diphtheria, and acellular pertussis (Td, Tdap) vaccine. Pregnant women should receive 1 dose of Tdap vaccine during each pregnancy. The dose should be obtained regardless of the length of time since the last dose. Immunization is preferred during the 27th-36th week of gestation. An adult who has not previously received Tdap or who does not know her vaccine status should receive 1 dose of Tdap. This initial dose should be followed by tetanus and diphtheria toxoids (Td) booster doses every 10 years. Adults with an unknown or incomplete history of completing a 3-dose immunization series with Td-containing vaccines should begin or complete a primary immunization series including a Tdap dose. Adults should receive a Td booster every 10 years.  ¨ Varicella vaccine. An adult without evidence of immunity to varicella should receive 2 doses or a second dose if she has previously received 1 dose. Pregnant females who do not have evidence of immunity should receive the first dose after pregnancy. This first dose should be obtained before leaving the health care facility. The second dose should be obtained 4-8 weeks after the first dose.  ¨ Human papillomavirus (HPV) vaccine. Females aged 13-26 years who have not received the vaccine previously should obtain the 3-dose series. The vaccine is not recommended for use in pregnant females. However, pregnancy testing is not needed before receiving a dose. If a female is found to be pregnant after receiving a dose, no treatment is needed. In that case, the remaining doses should be delayed until after the pregnancy. Immunization is recommended for any person with an immunocompromised condition through the age of 26 years if she did not get any or all doses earlier. During the 3-dose  series, the second dose should be obtained 4-8 weeks after the first dose. The third dose should be obtained 24 weeks after the first dose and 16 weeks after the second dose.  ¨ Zoster vaccine. One dose is recommended for adults aged 60 years or older unless certain conditions are present.  ¨ Measles, mumps, and rubella (MMR) vaccine. Adults born before 1957 generally are considered immune to measles and mumps. Adults born in 1957 or later should have 1 or more doses of MMR vaccine unless there is a contraindication to the vaccine or there is laboratory evidence of immunity to each of the three diseases. A routine second dose of MMR vaccine should be obtained at least 28 days after the first dose for students attending postsecondary schools, health care workers, or international travelers. People who received inactivated measles vaccine or an unknown type of measles vaccine during 1963-1967 should receive 2 doses of MMR vaccine. People who received inactivated mumps vaccine or an unknown type of mumps vaccine before 1979 and are at high risk for mumps infection should consider immunization with 2 doses of MMR vaccine. For females of childbearing age,   rubella immunity should be determined. If there is no evidence of immunity, females who are not pregnant should be vaccinated. If there is no evidence of immunity, females who are pregnant should delay immunization until after pregnancy. Unvaccinated health care workers born before 1957 who lack laboratory evidence of measles, mumps, or rubella immunity or laboratory confirmation of disease should consider measles and mumps immunization with 2 doses of MMR vaccine or rubella immunization with 1 dose of MMR vaccine.  ¨ Pneumococcal 13-valent conjugate (PCV13) vaccine. When indicated, a person who is uncertain of her immunization history and has no record of immunization should receive the PCV13 vaccine. An adult aged 19 years or older who has certain medical conditions  and has not been previously immunized should receive 1 dose of PCV13 vaccine. This PCV13 should be followed with a dose of pneumococcal polysaccharide (PPSV23) vaccine. The PPSV23 vaccine dose should be obtained at least 8 weeks after the dose of PCV13 vaccine. An adult aged 19 years or older who has certain medical conditions and previously received 1 or more doses of PPSV23 vaccine should receive 1 dose of PCV13. The PCV13 vaccine dose should be obtained 1 or more years after the last PPSV23 vaccine dose.  ¨ Pneumococcal polysaccharide (PPSV23) vaccine. When PCV13 is also indicated, PCV13 should be obtained first. All adults aged 65 years and older should be immunized. An adult younger than age 65 years who has certain medical conditions should be immunized. Any person who resides in a nursing home or long-term care facility should be immunized. An adult smoker should be immunized. People with an immunocompromised condition and certain other conditions should receive both PCV13 and PPSV23 vaccines. People with human immunodeficiency virus (HIV) infection should be immunized as soon as possible after diagnosis. Immunization during chemotherapy or radiation therapy should be avoided. Routine use of PPSV23 vaccine is not recommended for American Indians, [place] Natives, or people younger than 65 years unless there are medical conditions that require PPSV23 vaccine. When indicated, people who have unknown immunization and have no record of immunization should receive PPSV23 vaccine. One-time revaccination 5 years after the first dose of PPSV23 is recommended for people aged 19-64 years who have chronic kidney failure, nephrotic syndrome, asplenia, or immunocompromised conditions. People who received 1-2 doses of PPSV23 before age 65 years should receive another dose of PPSV23 vaccine at age 65 years or later if at least 5 years have passed since the previous dose. Doses of PPSV23 are not needed for people immunized  with PPSV23 at or after age 65 years.  ¨ Meningococcal vaccine. Adults with asplenia or persistent complement component deficiencies should receive 2 doses of quadrivalent meningococcal conjugate (MenACWY-D) vaccine. The doses should be obtained at least 2 months apart. Microbiologists working with certain meningococcal bacteria, military recruits, people at risk during an outbreak, and people who travel to or live in countries with a high rate of meningitis should be immunized. A first-year college student up through age 21 years who is living in a residence hall should receive a dose if she did not receive a dose on or after her 16th birthday. Adults who have certain high-risk conditions should receive one or more doses of vaccine.  ¨ Hepatitis A vaccine. Adults who wish to be protected from this disease, have certain high-risk conditions, work with hepatitis A-infected animals, work in hepatitis A research labs, or travel to or work in countries with a high rate of hepatitis A should be immunized. Adults who were previously   unvaccinated and who anticipate close contact with an international adoptee during the first 60 days after arrival in the [place] from a country with a high rate of hepatitis A should be immunized.  ¨ Hepatitis B vaccine. Adults who wish to be protected from this disease, have certain high-risk conditions, may be exposed to blood or other infectious body fluids, are household contacts or sex partners of hepatitis B positive people, are clients or workers in certain care facilities, or travel to or work in countries with a high rate of hepatitis B should be immunized.  ¨ Haemophilus influenzae type b (Hib) vaccine. A previously unvaccinated person with asplenia or sickle cell disease or having a scheduled splenectomy should receive 1 dose of Hib vaccine. Regardless of previous immunization, a recipient of a hematopoietic stem cell transplant should receive a 3-dose series 6-12 months  after her successful transplant. Hib vaccine is not recommended for adults with HIV infection.  Preventive Services / Frequency  Ages 19 to 39 years  · Blood pressure check.** / Every 1 to 2 years.  · Lipid and cholesterol check.** / Every 5 years beginning at age 20.  · Clinical breast exam.** / Every 3 years for women in their 20s and 30s.  · BRCA-related cancer risk assessment.** / For women who have family members with a BRCA-related cancer (breast, ovarian, tubal, or peritoneal cancers).  · Pap test.** / Every 2 years from ages 21 through 29. Every 3 years starting at age 30 through age 65 or 70 with a history of 3 consecutive normal Pap tests.  · HPV screening.** / Every 3 years from ages 30 through ages 65 to 70 with a history of 3 consecutive normal Pap tests.  · Hepatitis C blood test.** / For any individual with known risks for hepatitis C.  · Skin self-exam. / Monthly.  · Influenza vaccine. / Every year.  · Tetanus, diphtheria, and acellular pertussis (Tdap, Td) vaccine.** / Consult your health care provider. Pregnant women should receive 1 dose of Tdap vaccine during each pregnancy. 1 dose of Td every 10 years.  · Varicella vaccine.** / Consult your health care provider. Pregnant females who do not have evidence of immunity should receive the first dose after pregnancy.  · HPV vaccine. / 3 doses over 6 months, if 26 and younger. The vaccine is not recommended for use in pregnant females. However, pregnancy testing is not needed before receiving a dose.  · Measles, mumps, rubella (MMR) vaccine.** / You need at least 1 dose of MMR if you were born in 1957 or later. You may also need a 2nd dose. For females of childbearing age, rubella immunity should be determined. If there is no evidence of immunity, females who are not pregnant should be vaccinated. If there is no evidence of immunity, females who are pregnant should delay immunization until after pregnancy.  · Pneumococcal 13-valent conjugate (PCV13)  vaccine.** / Consult your health care provider.  · Pneumococcal polysaccharide (PPSV23) vaccine.** / 1 to 2 doses if you smoke cigarettes or if you have certain conditions.  · Meningococcal vaccine.** / 1 dose if you are age 19 to 21 years and a first-year college student living in a residence hall, or have one of several medical conditions, you need to get vaccinated against meningococcal disease. You may also need additional booster doses.  · Hepatitis A vaccine.** / Consult your health care provider.  · Hepatitis B vaccine.** / Consult your health care provider.  · Haemophilus   influenzae type b (Hib) vaccine.** / Consult your health care provider.  Ages 40 to 64 years  · Blood pressure check.** / Every 1 to 2 years.  · Lipid and cholesterol check.** / Every 5 years beginning at age 20 years.  · Lung cancer screening. / Every year if you are aged 55-80 years and have a 30-pack-year history of smoking and currently smoke or have quit within the past 15 years. Yearly screening is stopped once you have quit smoking for at least 15 years or develop a health problem that would prevent you from having lung cancer treatment.  · Clinical breast exam.** / Every year after age 40 years.  ·  BRCA-related cancer risk assessment.** / For women who have family members with a BRCA-related cancer (breast, ovarian, tubal, or peritoneal cancers).  · Mammogram.** / Every year beginning at age 40 years and continuing for as long as you are in good health. Consult with your health care provider.  · Pap test.** / Every 3 years starting at age 30 years through age 65 or 70 years with a history of 3 consecutive normal Pap tests.  · HPV screening.** / Every 3 years from ages 30 years through ages 65 to 70 years with a history of 3 consecutive normal Pap tests.  · Fecal occult blood test (FOBT) of stool. / Every year beginning at age 50 years and continuing until age 75 years. You may not need to do this test if you get a colonoscopy every  10 years.  · Flexible sigmoidoscopy or colonoscopy.** / Every 5 years for a flexible sigmoidoscopy or every 10 years for a colonoscopy beginning at age 50 years and continuing until age 75 years.  · Hepatitis C blood test.** / For all people born from 1945 through 1965 and any individual with known risks for hepatitis C.  · Skin self-exam. / Monthly.  · Influenza vaccine. / Every year.  · Tetanus, diphtheria, and acellular pertussis (Tdap/Td) vaccine.** / Consult your health care provider. Pregnant women should receive 1 dose of Tdap vaccine during each pregnancy. 1 dose of Td every 10 years.  · Varicella vaccine.** / Consult your health care provider. Pregnant females who do not have evidence of immunity should receive the first dose after pregnancy.  · Zoster vaccine.** / 1 dose for adults aged 60 years or older.  · Measles, mumps, rubella (MMR) vaccine.** / You need at least 1 dose of MMR if you were born in 1957 or later. You may also need a 2nd dose. For females of childbearing age, rubella immunity should be determined. If there is no evidence of immunity, females who are not pregnant should be vaccinated. If there is no evidence of immunity, females who are pregnant should delay immunization until after pregnancy.  · Pneumococcal 13-valent conjugate (PCV13) vaccine.** / Consult your health care provider.  · Pneumococcal polysaccharide (PPSV23) vaccine.** / 1 to 2 doses if you smoke cigarettes or if you have certain conditions.  · Meningococcal vaccine.** / Consult your health care provider.  · Hepatitis A vaccine.** / Consult your health care provider.  · Hepatitis B vaccine.** / Consult your health care provider.  · Haemophilus influenzae type b (Hib) vaccine.** / Consult your health care provider.  Ages 65 years and over  · Blood pressure check.** / Every 1 to 2 years.  · Lipid and cholesterol check.** / Every 5 years beginning at age 20 years.  · Lung cancer screening. / Every year if you are   aged 55-80  years and have a 30-pack-year history of smoking and currently smoke or have quit within the past 15 years. Yearly screening is stopped once you have quit smoking for at least 15 years or develop a health problem that would prevent you from having lung cancer treatment.  · Clinical breast exam.** / Every year after age 40 years.  ·  BRCA-related cancer risk assessment.** / For women who have family members with a BRCA-related cancer (breast, ovarian, tubal, or peritoneal cancers).  · Mammogram.** / Every year beginning at age 40 years and continuing for as long as you are in good health. Consult with your health care provider.  · Pap test.** / Every 3 years starting at age 30 years through age 65 or 70 years with 3 consecutive normal Pap tests. Testing can be stopped between 65 and 70 years with 3 consecutive normal Pap tests and no abnormal Pap or HPV tests in the past 10 years.  · HPV screening.** / Every 3 years from ages 30 years through ages 65 or 70 years with a history of 3 consecutive normal Pap tests. Testing can be stopped between 65 and 70 years with 3 consecutive normal Pap tests and no abnormal Pap or HPV tests in the past 10 years.  · Fecal occult blood test (FOBT) of stool. / Every year beginning at age 50 years and continuing until age 75 years. You may not need to do this test if you get a colonoscopy every 10 years.  · Flexible sigmoidoscopy or colonoscopy.** / Every 5 years for a flexible sigmoidoscopy or every 10 years for a colonoscopy beginning at age 50 years and continuing until age 75 years.  · Hepatitis C blood test.** / For all people born from 1945 through 1965 and any individual with known risks for hepatitis C.  · Osteoporosis screening.** / A one-time screening for women ages 65 years and over and women at risk for fractures or osteoporosis.  · Skin self-exam. / Monthly.  · Influenza vaccine. / Every year.  · Tetanus, diphtheria, and acellular pertussis (Tdap/Td) vaccine.** / 1 dose of  Td every 10 years.  · Varicella vaccine.** / Consult your health care provider.  · Zoster vaccine.** / 1 dose for adults aged 60 years or older.  · Pneumococcal 13-valent conjugate (PCV13) vaccine.** / Consult your health care provider.  · Pneumococcal polysaccharide (PPSV23) vaccine.** / 1 dose for all adults aged 65 years and older.  · Meningococcal vaccine.** / Consult your health care provider.  · Hepatitis A vaccine.** / Consult your health care provider.  · Hepatitis B vaccine.** / Consult your health care provider.  · Haemophilus influenzae type b (Hib) vaccine.** / Consult your health care provider.  ** Family history and personal history of risk and conditions may change your health care provider's recommendations.  Document Released: 01/09/2002 Document Revised: 03/30/2014 Document Reviewed: 04/10/2011  [employer]® Patient Information ©2015 [employer]. This information is not intended to replace advice given to you by your health care provider. Make sure you discuss any questions you have with your health care provider.

## 2015-03-04 NOTE — Progress Notes (Signed)
Subjective:    Patient ID: Kathy Hamilton, female    DOB: Jun 18, 1966, 49 y.o.   MRN: [id number]  HPI  [name] is a 49 year old female that presents to establish care. She states that she was previously a patient of Dr. Windle Guard at [facility] at [facility]. She states that she was unable to follow up with Dr. Jeannetta Nap due to insurance constraints.   Patient presents for annual exam and is without current complaints today.The patient is not sexually active. The patient wears seatbelts and sunscreen.  [name] states that her last pap smear was in January 2015. She states that she has not had a mammogram in several years and has never had a colonoscopy. She does not perform monthly self breast examinations. The patient has regular exercise 3-4 days per week, eats a balanced diet and drinks 4-5 glasses of water. She reports that she typically sleeps around 6 hours per night.  History   Social History  . Marital Status: Widowed    Spouse Name: N/A  . Number of Children: N/A  . Years of Education: N/A   Occupational History  . Not on file.   Social History Main Topics  . Smoking status: Former Games developer  . Smokeless tobacco: Not on file  . Alcohol Use: No  . Drug Use: No  . Sexual Activity: Yes   Other Topics Concern  . Not on file   Social History Narrative   Allergies  Allergen Reactions  . Oxycodone    Immunization History  Administered Date(s) Administered  . Tdap 03/04/2015   Review of Systems  Constitutional: Negative.  Negative for chills and fatigue.  HENT: Negative.   Eyes: Negative.  Negative for photophobia and visual disturbance.  Respiratory: Negative.  Negative for shortness of breath, wheezing and stridor.   Cardiovascular: Negative.   Gastrointestinal: Negative.   Endocrine: Negative.  Negative for polydipsia, polyphagia and polyuria.  Genitourinary: Negative.  Negative for vaginal discharge.  Musculoskeletal: Negative.   Skin: Negative.    Allergic/Immunologic: Negative.  Negative for environmental allergies, food allergies and immunocompromised state.  Neurological: Negative.  Negative for dizziness, facial asymmetry and speech difficulty.  Hematological: Negative.   Psychiatric/Behavioral: Negative.  Negative for suicidal ideas and sleep disturbance.       Objective:   Physical Exam  Constitutional: She is oriented to person, place, and time. She appears well-developed and well-nourished.  HENT:  Head: Normocephalic and atraumatic.  Right Ear: External ear normal.  Left Ear: External ear normal.  Mouth/Throat: Oropharynx is clear and moist.  Eyes: Conjunctivae are normal. Pupils are equal, round, and reactive to light.  Neck: Normal range of motion. Neck supple.  Cardiovascular: Normal rate, regular rhythm, normal heart sounds and intact distal pulses.   Abdominal: Soft. Normal appearance, normal aorta and bowel sounds are normal. There is no tenderness.  Musculoskeletal: Normal range of motion.  Lymphadenopathy:       Head (right side): No submental and no submandibular adenopathy present.       Head (left side): No submental and no submandibular adenopathy present.    She has no cervical adenopathy.    She has no axillary adenopathy.  Neurological: She is alert and oriented to person, place, and time. She has normal strength and normal reflexes. She displays a negative Romberg sign.  Reflex Scores:      Tricep reflexes are 2+ on the right side and 2+ on the left side.  Bicep reflexes are 2+ on the right side and 2+ on the left side.      Brachioradialis reflexes are 2+ on the right side and 2+ on the left side.      Patellar reflexes are 2+ on the right side and 2+ on the left side.      Achilles reflexes are 2+ on the right side and 2+ on the left side. Skin: Skin is warm, dry and intact. No cyanosis. Nails show no clubbing.  Psychiatric: She has a normal mood and affect. Her behavior is normal. Judgment and  thought content normal.      BP 134/74 mmHg  Pulse 79  Temp(Src) 98.2 F (36.8 C) (Oral)  Resp 16  Ht 5\' 3"  (1.6 m)  Wt 190 lb (86.183 kg)  BMI 33.67 kg/m2  LMP 02/06/2015  1. Annual physical exam Patient presented for complete physical examination. Patient is up to date with pap smear. She states that pap smear was in January 2015, which was normal. She is up to date with vaccinations. Patient is a non smoker/non drinker.  Chart reviewed: immunizations are up to date and documented. Discussed balanced diet, and given written materials. Recommended 150 minutes of cardiovascular exercise weekly.   - POCT urinalysis dipstick - CBC with Differential  - COMPLETE METABOLIC PANEL WITH GFR - TSH - Hemoglobin A1c - MM DIGITAL SCREENING BILATERAL; Future  2. Immunization due - Tdap vaccine greater than or equal to 7yo IM

## 2015-03-05 LAB — HEMOGLOBIN A1C
Hgb A1c MFr Bld: 5.6 % (ref <5.7)
Mean Plasma Glucose: 114 mg/dL (ref <117)

## 2016-03-02 ENCOUNTER — Ambulatory Visit: Payer: Medicaid Other | Admitting: Internal Medicine

## 2016-03-27 ENCOUNTER — Encounter: Payer: Self-pay | Admitting: Family Medicine

## 2016-03-27 ENCOUNTER — Ambulatory Visit (INDEPENDENT_AMBULATORY_CARE_PROVIDER_SITE_OTHER): Payer: Medicaid Other | Admitting: Family Medicine

## 2016-03-27 VITALS — BP 138/78 | HR 81 | Temp 98.1°F | Ht 63.0 in | Wt 184.0 lb

## 2016-03-27 DIAGNOSIS — Z833 Family history of diabetes mellitus: Secondary | ICD-10-CM

## 2016-03-27 DIAGNOSIS — Z8342 Family history of familial hypercholesterolemia: Secondary | ICD-10-CM | POA: Diagnosis not present

## 2016-03-27 DIAGNOSIS — Z1231 Encounter for screening mammogram for malignant neoplasm of breast: Secondary | ICD-10-CM

## 2016-03-27 DIAGNOSIS — T148 Other injury of unspecified body region: Secondary | ICD-10-CM

## 2016-03-27 DIAGNOSIS — W57XXXA Bitten or stung by nonvenomous insect and other nonvenomous arthropods, initial encounter: Secondary | ICD-10-CM | POA: Insufficient documentation

## 2016-03-27 LAB — LIPID PANEL
CHOL/HDL RATIO: 3.1 ratio (ref <=5.0)
CHOLESTEROL: 158 mg/dL (ref 125–200)
HDL: 51 mg/dL (ref >=46)
LDL Cholesterol: 70 mg/dL (ref <130)
Triglycerides: 184 mg/dL — ABNORMAL HIGH (ref <150)
VLDL: 37 mg/dL — AB (ref <30)

## 2016-03-27 LAB — GLUCOSE, RANDOM: GLUCOSE: 105 mg/dL — AB (ref 65–99)

## 2016-03-27 NOTE — Patient Instructions (Signed)
Call and make an appointment for mammogram Continue exercising regularly Watch fats, cholesterol, and carbohydrates in diet. Return in one year.

## 2016-03-27 NOTE — Progress Notes (Signed)
Patient ID: Kathy McmurrayLisa P Hamilton, female   DOB: 06/13/1966, 50 y.o.   MRN: [id number]   Kathy Hamilton, is a 50 y.o. female  [id number]  [id number]  DOB - 12/01/1965  CC:  Chief Complaint  Patient presents with  . new patient/ get established    needs check up, chigger bites on arms and legs, works at [employer], has been using hydrocortisone , since gallbladder removed in 1994 she has bowel movement issues       HPI: Kathy GemmaLisa Hamilton is a 50 y.o. female here for annual review. Her only complaints today are of chigger bites (gets at work). She also mentions a change in stools since Gallbladder surgery in 1994 but is no an issue she thinks needs addressing. She has no chronic illnesses, is on no regular medictions. She has a family history of diabetes in mother and grandmother and breast cancer in sister who is in early 950s. She had a mammogram ordered about a year ago but did not have that done.  Her PAP is UTD and was normal. Her immunizations are up to date. She denies tobacco, alcohol, or illicit drug use. She reports exercising regularly but is having trouble losing belly fat. She is sexually active, uses her seat belts and sunscreen regularly.    Allergies  Allergen Reactions  . Oxycodone    History reviewed. No pertinent past medical history. Current Outpatient Prescriptions on File Prior to Visit  Medication Sig Dispense Refill  . acetaminophen (TYLENOL) 500 MG tablet Take 500 mg by mouth every 6 (six) hours as needed. Reported on 03/27/2016    . ipratropium (ATROVENT) 0.06 % nasal spray Place 2 sprays into both nostrils 4 (four) times daily. (Patient not taking: Reported on 03/27/2016) 15 mL 1  . Multiple Vitamin (MULTIVITAMIN WITH MINERALS) TABS Take 1 tablet by mouth daily. Reported on 03/27/2016    . PRESCRIPTION MEDICATION Take 1-2 tablets by mouth every 8 (eight) hours as needed. Reported on 03/27/2016     No current facility-administered medications on file prior to visit.   Family History  Problem  Relation Age of Onset  . Cancer Mother   . Stroke Mother   . Heart attack Mother   . Bipolar disorder Sister   . Stroke Brother   . Asthma Daughter   . Bipolar disorder Daughter   . ADD / ADHD Son   . Asperger's syndrome Son    Social History   Social History  . Marital Status: Widowed    Spouse Name: N/A  . Number of Children: N/A  . Years of Education: N/A   Occupational History  . Not on file.   Social History Main Topics  . Smoking status: Former Games developermoker  . Smokeless tobacco: Not on file  . Alcohol Use: No  . Drug Use: No  . Sexual Activity: Yes   Other Topics Concern  . Not on file   Social History Narrative    Review of Systems: Constitutional: Negative for fever, chills, appetite change, weight loss. Does complain of fatigue but not excessive Skin: Negative for rashes or lesions of concern. HENT: Negative for ear pain, ear discharge.nose bleeds Eyes: Negative for pain, discharge, redness, itching and visual disturbance. Neck: Negative for pain, stiffness Respiratory: Negative for cough, shortness of breath. Cardiovascular: Negative for chest pain, palpitations. Occasional swelling of lower legs and feet at end of work day Gastrointestinal: Negative for abdominal pain, nausea, vomiting, diarrhea, constipations Genitourinary: Negative for dysuria, urgency, frequency, hematuria. Positive for nocturia Musculoskeletal:  Negative for back pain, joint pain, joint  swelling, and gait problem.Negative for weakness. Neurological: Negative for tremors, seizures, syncope, numbness and headaches. Admits to occasional lightheadedness Hematological: Negative for easy bruising or bleeding Psychiatric/Behavioral: Negative for depression, anxiety, decreased concentration, confusion   Objective:   Filed Vitals:   03/27/16 1011 03/27/16 1045  BP: 148/81 138/78  Pulse: 81   Temp: 98.1 F (36.7 C)     Physical Exam: Constitutional: Patient appears well-developed and  well-nourished. No distress. HENT: Normocephalic, atraumatic, External right and left ear normal. Oropharynx is clear and moist.  Eyes: Conjunctivae and EOM are normal. PERRLA, no scleral icterus. Neck: Normal ROM. Neck supple. No lymphadenopathy, No thyromegaly. CVS: RRR, S1/S2 +, no murmurs, no gallops, no rubs Pulmonary: Effort and breath sounds normal, no stridor, rhonchi, wheezes, rales.  Abdominal: Soft. Normoactive BS,, no distension, tenderness, rebound or guarding.  Musculoskeletal: Normal range of motion. No edema and no tenderness.  Neuro: Alert.Normal muscle tone coordination. Non-focal Skin: Skin is warm and dry. No rash noted. Not diaphoretic. No erythema. No pallor. Psychiatric: Normal mood and affect. Behavior, judgment, thought content normal.  Lab Results  Component Value Date   WBC 9.0 03/04/2015   HGB 14.1 03/04/2015   HCT 43.3 03/04/2015   MCV 89.3 03/04/2015   PLT 367 03/04/2015   Lab Results  Component Value Date   CREATININE 0.62 03/04/2015   BUN 17 03/04/2015   NA 138 03/04/2015   K 4.1 03/04/2015   CL 105 03/04/2015   CO2 23 03/04/2015    Lab Results  Component Value Date   HGBA1C 5.6 03/04/2015   Lipid Panel  No results found for: CHOL, TRIG, HDL, CHOLHDL, VLDL, LDLCALC     Assessment and plan:   1. Family history of diabetes mellitus  - Glucose, Random  2. Family history of hypercholesterolemia  - Lipid panel  3. Visit for screening mammogram  - MM Digital Screening; Future   Return in about 1 year (around 03/27/2017).  The patient was given clear instructions to go to ER or return to medical center if symptoms don't improve, worsen or new problems develop. The patient verbalized understanding.    Henrietta Hoover FNP  03/27/2016, 11:20 AM

## 2016-05-01 ENCOUNTER — Ambulatory Visit
Admission: RE | Admit: 2016-05-01 | Discharge: 2016-05-01 | Disposition: A | Discharge status: Home / Self Care | Payer: Medicaid Other | Source: Ambulatory Visit | Attending: Family Medicine | Admitting: Family Medicine

## 2016-05-01 DIAGNOSIS — Z1231 Encounter for screening mammogram for malignant neoplasm of breast: Secondary | ICD-10-CM

## 2016-05-02 ENCOUNTER — Other Ambulatory Visit: Payer: Self-pay | Admitting: Family Medicine

## 2016-05-02 DIAGNOSIS — R928 Other abnormal and inconclusive findings on diagnostic imaging of breast: Secondary | ICD-10-CM

## 2016-05-11 ENCOUNTER — Ambulatory Visit
Admission: RE | Admit: 2016-05-11 | Discharge: 2016-05-11 | Disposition: A | Discharge status: Home / Self Care | Payer: Medicaid Other | Source: Ambulatory Visit | Attending: Family Medicine | Admitting: Family Medicine

## 2016-05-11 DIAGNOSIS — R928 Other abnormal and inconclusive findings on diagnostic imaging of breast: Secondary | ICD-10-CM

## 2016-05-15 ENCOUNTER — Encounter (HOSPITAL_COMMUNITY): Payer: Self-pay

## 2016-05-15 ENCOUNTER — Emergency Department (HOSPITAL_COMMUNITY)
Admission: EM | Admit: 2016-05-15 | Discharge: 2016-05-15 | Disposition: A | Discharge status: Home / Self Care | Payer: Medicaid Other | Attending: Emergency Medicine | Admitting: Emergency Medicine

## 2016-05-15 DIAGNOSIS — Y9389 Activity, other specified: Secondary | ICD-10-CM | POA: Diagnosis not present

## 2016-05-15 DIAGNOSIS — W57XXXA Bitten or stung by nonvenomous insect and other nonvenomous arthropods, initial encounter: Secondary | ICD-10-CM | POA: Insufficient documentation

## 2016-05-15 DIAGNOSIS — Y999 Unspecified external cause status: Secondary | ICD-10-CM | POA: Insufficient documentation

## 2016-05-15 DIAGNOSIS — S90869A Insect bite (nonvenomous), unspecified foot, initial encounter: Secondary | ICD-10-CM | POA: Diagnosis not present

## 2016-05-15 DIAGNOSIS — Y92512 Supermarket, store or market as the place of occurrence of the external cause: Secondary | ICD-10-CM | POA: Insufficient documentation

## 2016-05-15 DIAGNOSIS — S20369A Insect bite (nonvenomous) of unspecified front wall of thorax, initial encounter: Secondary | ICD-10-CM | POA: Insufficient documentation

## 2016-05-15 DIAGNOSIS — Z87891 Personal history of nicotine dependence: Secondary | ICD-10-CM | POA: Insufficient documentation

## 2016-05-15 DIAGNOSIS — Z79899 Other long term (current) drug therapy: Secondary | ICD-10-CM | POA: Insufficient documentation

## 2016-05-15 DIAGNOSIS — L298 Other pruritus: Secondary | ICD-10-CM | POA: Insufficient documentation

## 2016-05-15 DIAGNOSIS — S80869A Insect bite (nonvenomous), unspecified lower leg, initial encounter: Secondary | ICD-10-CM | POA: Diagnosis not present

## 2016-05-15 DIAGNOSIS — S30860A Insect bite (nonvenomous) of lower back and pelvis, initial encounter: Secondary | ICD-10-CM | POA: Diagnosis present

## 2016-05-15 MED ORDER — PREDNISONE 10 MG (21) PO TBPK: 10.0000 mg | ORAL_TABLET | Freq: Every day | ORAL | Status: DC

## 2016-05-15 MED ORDER — HYDROXYZINE HCL 10 MG PO TABS
10.0000 mg | ORAL_TABLET | Freq: Once | ORAL | Status: AC
  Administered 2016-05-15: 10 mg via ORAL
  Filled 2016-05-15: qty 1

## 2016-05-15 MED ORDER — CEPHALEXIN 500 MG PO CAPS: 500.0000 mg | ORAL_CAPSULE | Freq: Four times a day (QID) | ORAL | Status: DC

## 2016-05-15 MED ORDER — HYDROXYZINE HCL 25 MG PO TABS: 25.0000 mg | ORAL_TABLET | Freq: Four times a day (QID) | ORAL | Status: DC

## 2016-05-15 NOTE — ED Provider Notes (Signed)
CSN: [id number]     Arrival date & time 05/15/16  1114 History  By signing my name below, I, Kathy Hamilton, attest that this documentation has been prepared under the direction and in the presence of Harolyn Rutherford, PA-C Electronically Signed: Charline Bills, ED Scribe 05/15/2016 at 12:39 PM.  No chief complaint on file.  The history is provided by the patient. No language interpreter was used.   HPI Comments: ELLIANAH Hamilton is a 50 y.o. female who presents to the Emergency Department complaining of recurrent generalized bites first noticed 2 months ago. Pt states that she works in a [employer] and suspects that she is getting bit at work by an unknown insect. Pt reports constant itching and blistering to the bites on her arms, legs, back, chest, feet and buttocks. She denies having the rash in her mouth or genitals. She has noticed that when she takes 3-4 days off of work the areas resolve, but when she returns to work the bites reappear. Pt has tried wearing pants and using Off insect spray without relief. She denies camping or spending any time outside recently. Patient adds that many of her coworkers that work by her side have similar lesions. Denies fever/chills, shortness of breath, or any other complaints or symptoms.  History reviewed. No pertinent past medical history. Past Surgical History  Procedure Laterality Date  . Cholecystectomy    . Orthopedic surgery Left 2013    rotator cuff   Family History  Problem Relation Age of Onset  . Cancer Mother   . Stroke Mother   . Heart attack Mother   . Bipolar disorder Sister   . Stroke Brother   . Asthma Daughter   . Bipolar disorder Daughter   . ADD / ADHD Son   . Asperger's syndrome Son    Social History  Substance Use Topics  . Smoking status: Former Games developer  . Smokeless tobacco: None  . Alcohol Use: No   OB History    No data available     Review of Systems  Constitutional: Negative for fever and  chills.  Respiratory: Negative for shortness of breath.   Gastrointestinal: Negative for nausea and vomiting.  Skin: Positive for rash.  Neurological: Negative for headaches.  All other systems reviewed and are negative.  Allergies  Oxycodone  Home Medications   Prior to Admission medications   Medication Sig Start Date End Date Taking? Authorizing Provider  acetaminophen (TYLENOL) 500 MG tablet Take 500 mg by mouth every 6 (six) hours as needed. Reported on 03/27/2016    Historical Provider, MD  cephALEXin (KEFLEX) 500 MG capsule Take 1 capsule (500 mg total) by mouth 4 (four) times daily. 05/15/16   Syncere Kaminski C Christene Pounds, PA-C  hydrOXYzine (ATARAX/VISTARIL) 25 MG tablet Take 1 tablet (25 mg total) by mouth every 6 (six) hours. 05/15/16   Thedora Rings C Nicey Krah, PA-C  ipratropium (ATROVENT) 0.06 % nasal spray Place 2 sprays into both nostrils 4 (four) times daily. Patient not taking: Reported on 03/27/2016 01/29/15   Linna Hoff, MD  Multiple Vitamin (MULTIVITAMIN WITH MINERALS) TABS Take 1 tablet by mouth daily. Reported on 03/27/2016    Historical Provider, MD  predniSONE (STERAPRED UNI-PAK 21 TAB) 10 MG (21) TBPK tablet Take 1 tablet (10 mg total) by mouth daily. Take 6 tabs by mouth daily  for 2 days, then 5 tabs for 2 days, then 4 tabs for 2 days, then 3 tabs for 2 days, 2 tabs for  2 days, then 1 tab by mouth daily for 2 days 05/15/16   Anselm PancoastShawn C Clements Toro, PA-C  PRESCRIPTION MEDICATION Take 1-2 tablets by mouth every 8 (eight) hours as needed. Reported on 03/27/2016    Historical Provider, MD   BP 183/99 mmHg  Pulse 83  Temp(Src) 98.9 F (37.2 C)  Resp 18  Ht 5\' 3"  (1.6 m)  Wt 185 lb (83.915 kg)  BMI 32.78 kg/m2  SpO2 98% Physical Exam  Constitutional: She is oriented to person, place, and time. She appears well-developed and well-nourished. No distress.  HENT:  Head: Normocephalic and atraumatic.  Mouth/Throat: Oropharynx is clear and moist.  No oral lesions  Eyes: Conjunctivae are normal. Pupils are equal,  round, and reactive to light.  Neck: Neck supple.  Cardiovascular: Normal rate, regular rhythm, normal heart sounds and intact distal pulses.   Pulmonary/Chest: Effort normal and breath sounds normal. No respiratory distress.  Abdominal: Soft. There is no tenderness. There is no guarding.  Musculoskeletal: She exhibits no edema or tenderness.  Lymphadenopathy:    She has no cervical adenopathy.  Neurological: She is alert and oriented to person, place, and time.  Skin: Skin is warm and dry. She is not diaphoretic.  Scattered singular erythematous lesions consistent with insect bites. Some areas noted to be open sores in various stages of healing. Spares palms of hands, soles of the feet, and genitals.  Psychiatric: She has a normal mood and affect. Her behavior is normal.  Nursing note and vitals reviewed.  ED Course  Procedures (including critical care time) DIAGNOSTIC STUDIES: Oxygen Saturation is 98% on RA, normal by my interpretation.    COORDINATION OF CARE: 12:33 PM-Discussed treatment plan which includes atrax, Keflex, Prednisone with pt at bedside and pt agreed to plan.   MDM   Final diagnoses:  Insect bites    Kathy McmurrayLisa P Hamilton presents with scattered, pruritic skin lesions for last 2 months.  Suspect the patient's lesions may be coming from an insect or possibly an allergic reaction. The fact that the lesions clear when the patient has not at work, gives more evidence to these suspicions. Lesions are made worse by the patient's scratching. Keflex and hydroxyzine prescribed. Advised patient to wear long pants and long sleeve shirts, a pill to management, and, if possible, change to working in a different location. Follow up with PCP.  I personally performed the services described in this documentation, which was scribed in my presence. The recorded information has been reviewed and is accurate.    Anselm PancoastShawn C Josearmando Kuhnert, PA-C 05/15/16 1705  Azalia BilisKevin Campos, MD 05/16/16 [phone]

## 2016-05-15 NOTE — ED Notes (Signed)
Declined W/C at D/C and was escorted to lobby by RN. 

## 2016-05-15 NOTE — ED Notes (Signed)
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 Co-workers have same

## 2016-05-15 NOTE — Discharge Instructions (Signed)
You have been seen today for insect bites. The hydroxyzine should help with itching. Prednisone is to reduce inflammation and itching. The Keflex is to prevent infection. Follow up with PCP as needed should symptoms continue. Return to ED should symptoms worsen. Try to reduce exposure to the areas where these bug bites are occurring. Wear long sleeves and long pants whenever possible.

## 2018-04-01 ENCOUNTER — Emergency Department (HOSPITAL_COMMUNITY): Payer: Medicaid Other

## 2018-04-01 ENCOUNTER — Encounter (HOSPITAL_COMMUNITY): Payer: Self-pay

## 2018-04-01 DIAGNOSIS — J181 Lobar pneumonia, unspecified organism: Secondary | ICD-10-CM | POA: Insufficient documentation

## 2018-04-01 DIAGNOSIS — Z87891 Personal history of nicotine dependence: Secondary | ICD-10-CM | POA: Insufficient documentation

## 2018-04-01 MED ORDER — ALBUTEROL SULFATE (2.5 MG/3ML) 0.083% IN NEBU
5.0000 mg | INHALATION_SOLUTION | Freq: Once | RESPIRATORY_TRACT | Status: AC
  Administered 2018-04-02: 5 mg via RESPIRATORY_TRACT
  Filled 2018-04-01: qty 6

## 2018-04-01 NOTE — ED Triage Notes (Signed)
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

## 2018-04-02 ENCOUNTER — Emergency Department (HOSPITAL_COMMUNITY)
Admission: EM | Admit: 2018-04-02 | Discharge: 2018-04-02 | Disposition: A | Discharge status: Home / Self Care | Payer: Self-pay | Attending: Emergency Medicine | Admitting: Emergency Medicine

## 2018-04-02 DIAGNOSIS — J189 Pneumonia, unspecified organism: Secondary | ICD-10-CM

## 2018-04-02 DIAGNOSIS — J181 Lobar pneumonia, unspecified organism: Secondary | ICD-10-CM

## 2018-04-02 MED ORDER — AZITHROMYCIN 250 MG PO TABS: 250.0000 mg | ORAL_TABLET | Freq: Every day | ORAL | 0 refills | Status: DC

## 2018-04-02 MED ORDER — PREDNISONE 20 MG PO TABS
60.0000 mg | ORAL_TABLET | Freq: Once | ORAL | Status: AC
  Administered 2018-04-02: 60 mg via ORAL
  Filled 2018-04-02: qty 3

## 2018-04-02 MED ORDER — AZITHROMYCIN 250 MG PO TABS
500.0000 mg | ORAL_TABLET | Freq: Once | ORAL | Status: AC
  Administered 2018-04-02: 500 mg via ORAL
  Filled 2018-04-02: qty 2

## 2018-04-02 MED ORDER — PREDNISONE 20 MG PO TABS: 40.0000 mg | ORAL_TABLET | Freq: Every day | ORAL | 0 refills | Status: DC

## 2018-04-02 MED ORDER — ALBUTEROL SULFATE HFA 108 (90 BASE) MCG/ACT IN AERS: 2.0000 | INHALATION_SPRAY | RESPIRATORY_TRACT | Status: DC | PRN

## 2018-04-02 NOTE — ED Provider Notes (Signed)
[facility]-EMERGENCY DEPT Provider Note   CSN: [id number] Arrival date & time: 04/01/18  1954     History   Chief Complaint Chief Complaint  Patient presents with  . Cough  . Shortness of Breath    HPI Kathy Hamilton is a 52 y.o. female.  Patient presents to the emergency department for evaluation of cough, chest congestion, wheezing.  Patient reports that symptoms began 3 days ago.  She reports that she was at work and someone coughed on her just prior to onset of her symptoms.  Patient does not have asthma or COPD, but has required albuterol with previous upper respiratory infections.  She was given a nebulizer treatment in triage for wheezing, breathing improved.  Patient reports headache and pain when she coughs.     History reviewed. No pertinent past medical history.  Patient Active Problem List   Diagnosis Date Noted  . Bug bites 03/27/2016  . Family history of diabetes mellitus 03/27/2016  . Family history of hypercholesterolemia 03/27/2016    Past Surgical History:  Procedure Laterality Date  . CHOLECYSTECTOMY    . ORTHOPEDIC SURGERY Left 2013   rotator cuff     OB History   None      Home Medications    Prior to Admission medications   Medication Sig Start Date End Date Taking? Authorizing Provider  ibuprofen (ADVIL,MOTRIN) 200 MG tablet Take 400 mg by mouth every 6 (six) hours as needed for headache, mild pain or moderate pain.   Yes [provider]  azithromycin (ZITHROMAX) 250 MG tablet Take 1 tablet (250 mg total) by mouth daily. 04/02/18   Gilda Crease, MD  hydrOXYzine (ATARAX/VISTARIL) 25 MG tablet Take 1 tablet (25 mg total) by mouth every 6 (six) hours. Patient not taking: Reported on 04/02/2018 05/15/16   Joy, Ines Bloomer C, PA-C  ipratropium (ATROVENT) 0.06 % nasal spray Place 2 sprays into both nostrils 4 (four) times daily. Patient not taking: Reported on 03/27/2016 01/29/15   Linna Hoff, MD  predniSONE (DELTASONE)  20 MG tablet Take 2 tablets (40 mg total) by mouth daily with breakfast. 04/02/18   Saksham Akkerman, Canary Brim, MD    Family History Family History  Problem Relation Age of Onset  . Cancer Mother   . Stroke Mother   . Heart attack Mother   . Bipolar disorder Sister   . Stroke Brother   . Asthma Daughter   . Bipolar disorder Daughter   . ADD / ADHD Son   . Asperger's syndrome Son     Social History Social History   Tobacco Use  . Smoking status: Former Games developer  . Smokeless tobacco: Never Used  Substance Use Topics  . Alcohol use: No  . Drug use: No     Allergies   Oxycodone   Review of Systems Review of Systems  Respiratory: Positive for cough and wheezing.   Neurological: Positive for headaches.  All other systems reviewed and are negative.    Physical Exam Updated Vital Signs BP (!) 180/96 (BP Location: Right Arm)   Pulse 77   Temp 99.2 F (37.3 C) (Oral)   Resp 18   Ht  (1.6 m)   Wt 89.6 kg (197 lb 9.6 oz)   LMP 03/07/2018   SpO2 99%   BMI 35.00 kg/m   Physical Exam  Constitutional: She is oriented to person, place, and time. She appears well-developed and well-nourished. No distress.  HENT:  Head: Normocephalic and atraumatic.  Right Ear: Hearing normal.  Left Ear: Hearing normal.  Nose: Nose normal.  Mouth/Throat: Oropharynx is clear and moist and mucous membranes are normal.  Eyes: Pupils are equal, round, and reactive to light. Conjunctivae and EOM are normal.  Neck: Normal range of motion. Neck supple.  Cardiovascular: Regular rhythm, S1 normal and S2 normal. Exam reveals no gallop and no friction rub.  No murmur heard. Pulmonary/Chest: Effort normal. No respiratory distress. She has wheezes. She exhibits no tenderness.  Abdominal: Soft. Normal appearance and bowel sounds are normal. There is no hepatosplenomegaly. There is no tenderness. There is no rebound, no guarding, no tenderness at McBurney's point and negative Murphy's sign. No hernia.    Musculoskeletal: Normal range of motion.  Neurological: She is alert and oriented to person, place, and time. She has normal strength. No cranial nerve deficit or sensory deficit. Coordination normal. GCS eye subscore is 4. GCS verbal subscore is 5. GCS motor subscore is 6.  Skin: Skin is warm, dry and intact. No rash noted. No cyanosis.  Psychiatric: She has a normal mood and affect. Her speech is normal and behavior is normal. Thought content normal.  Nursing note and vitals reviewed.    ED Treatments / Results  Labs (all labs ordered are listed, but only abnormal results are displayed) Labs Reviewed - No data to display  EKG None  Radiology Dg Chest 2 View  Result Date: 04/01/2018 CLINICAL DATA:  52 year old female with cough and chest congestion. EXAM: CHEST - 2 VIEW COMPARISON:  Chest radiograph dated 11/19/2014 FINDINGS: Minimal left lung base streak densities may represent atelectatic changes although infiltrate is not excluded. Clinical correlation recommended. The right lung is clear. There is no pleural effusion or pneumothorax. The cardiac silhouette is within normal limits. No acute osseous pathology. IMPRESSION: Left lung base atelectasis versus less likely infiltrate. Clinical correlation is recommended. Electronically Signed   By: Elgie Collard M.D.   On: 04/01/2018 22:50    Procedures Procedures (including critical care time)  Medications Ordered in ED Medications  albuterol (PROVENTIL) (2.5 MG/3ML) 0.083% nebulizer solution 5 mg (5 mg Nebulization Not Given 04/02/18 0003)  predniSONE (DELTASONE) tablet 60 mg (has no administration in time range)  azithromycin (ZITHROMAX) tablet 500 mg (has no administration in time range)  albuterol (PROVENTIL HFA;VENTOLIN HFA) 108 (90 Base) MCG/ACT inhaler 2 puff (has no administration in time range)     Initial Impression / Assessment and Plan / ED Course  I have reviewed the triage vital signs and the nursing  notes.  Pertinent labs & imaging results that were available during my care of the patient were reviewed by me and considered in my medical decision making (see chart for details).     Patient presents to the emergency department for evaluation of cough and chest congestion.  She did require a nebulizer treatment in triage with improvement of wheezing.  She has normal air movement currently with room air oxygen saturation of 98%.  Chest x-ray does raise concern for pneumonia.  Patient appropriate for outpatient treatment.  She will be treated with prednisone, albuterol and Zithromax.  Final Clinical Impressions(s) / ED Diagnoses   Final diagnoses:  Community acquired pneumonia of left lower lobe of lung [facility])    ED Discharge Orders        Ordered    azithromycin (ZITHROMAX) 250 MG tablet  Daily     04/02/18 0034    predniSONE (DELTASONE) 20 MG tablet  Daily with breakfast  04/02/18 0034       Gilda Crease, MD 04/02/18 [phone]

## 2018-04-06 ENCOUNTER — Other Ambulatory Visit: Payer: Self-pay

## 2018-04-06 ENCOUNTER — Emergency Department (HOSPITAL_COMMUNITY)
Admission: EM | Admit: 2018-04-06 | Discharge: 2018-04-07 | Disposition: A | Discharge status: Home / Self Care | Payer: Medicaid Other | Attending: Emergency Medicine | Admitting: Emergency Medicine

## 2018-04-06 ENCOUNTER — Emergency Department (HOSPITAL_COMMUNITY): Payer: Medicaid Other

## 2018-04-06 ENCOUNTER — Encounter (HOSPITAL_COMMUNITY): Payer: Self-pay

## 2018-04-06 DIAGNOSIS — J40 Bronchitis, not specified as acute or chronic: Secondary | ICD-10-CM

## 2018-04-06 DIAGNOSIS — Z7722 Contact with and (suspected) exposure to environmental tobacco smoke (acute) (chronic): Secondary | ICD-10-CM | POA: Insufficient documentation

## 2018-04-06 DIAGNOSIS — R739 Hyperglycemia, unspecified: Secondary | ICD-10-CM | POA: Insufficient documentation

## 2018-04-06 DIAGNOSIS — R030 Elevated blood-pressure reading, without diagnosis of hypertension: Secondary | ICD-10-CM | POA: Insufficient documentation

## 2018-04-06 LAB — URINALYSIS, ROUTINE W REFLEX MICROSCOPIC
BILIRUBIN URINE: NEGATIVE
GLUCOSE, UA: NEGATIVE mg/dL
HGB URINE DIPSTICK: NEGATIVE
Ketones, ur: NEGATIVE mg/dL
Leukocytes, UA: NEGATIVE
Nitrite: NEGATIVE
PH: 7 (ref 5.0–8.0)
Protein, ur: NEGATIVE mg/dL
SPECIFIC GRAVITY, URINE: 1.006 (ref 1.005–1.030)

## 2018-04-06 LAB — CBG MONITORING, ED: Glucose-Capillary: 157 mg/dL — ABNORMAL HIGH (ref 65–99)

## 2018-04-06 LAB — CBC
HEMATOCRIT: 41.3 % (ref 36.0–46.0)
HEMOGLOBIN: 13.7 g/dL (ref 12.0–15.0)
MCH: 29.6 pg (ref 26.0–34.0)
MCHC: 33.2 g/dL (ref 30.0–36.0)
MCV: 89.2 fL (ref 78.0–100.0)
Platelets: 355 10*3/uL (ref 150–400)
RBC: 4.63 MIL/uL (ref 3.87–5.11)
RDW: 13.7 % (ref 11.5–15.5)
WBC: 9.7 10*3/uL (ref 4.0–10.5)

## 2018-04-06 LAB — I-STAT BETA HCG BLOOD, ED (MC, WL, AP ONLY): I-stat hCG, quantitative: <5 m[IU]/mL (ref <5)

## 2018-04-06 LAB — BASIC METABOLIC PANEL
ANION GAP: 9 (ref 5–15)
BUN: 14 mg/dL (ref 6–20)
CALCIUM: 8.8 mg/dL — AB (ref 8.9–10.3)
CO2: 23 mmol/L (ref 22–32)
Chloride: 105 mmol/L (ref 101–111)
Creatinine, Ser: 0.62 mg/dL (ref 0.44–1.00)
GFR calc Af Amer: >60 mL/min (ref >60)
GFR calc non Af Amer: >60 mL/min (ref >60)
Glucose, Bld: 163 mg/dL — ABNORMAL HIGH (ref 65–99)
POTASSIUM: 4.1 mmol/L (ref 3.5–5.1)
Sodium: 137 mmol/L (ref 135–145)

## 2018-04-06 MED ORDER — IPRATROPIUM-ALBUTEROL 0.5-2.5 (3) MG/3ML IN SOLN
3.0000 mL | Freq: Once | RESPIRATORY_TRACT | Status: AC
Start: 2018-04-06 — End: 2018-04-07
  Administered 2018-04-07: 3 mL via RESPIRATORY_TRACT
  Filled 2018-04-06: qty 3

## 2018-04-06 NOTE — ED Provider Notes (Signed)
[facility]-EMERGENCY DEPT Provider Note   CSN: [id number] Arrival date & time: 04/06/18  1615     History   Chief Complaint Chief Complaint  Patient presents with  . Cough  . Weakness    HPI Kathy Hamilton is a 52 y.o. female.  The history is provided by the patient.  She had been seen in the emergency department 4 days ago, diagnosed with pneumonia and sent home with prescription for prednisone and azithromycin.  She states she has taken the medication, but she continues to have a harsh cough which is productive of green sputum.  She has had subjective fever without chills or sweats, but she feels generally weak.  Today, she got very lightheaded and had a near syncopal episode.  She does have pain in her chest when she coughs, but no chest pain without coughing.  She denies nausea or vomiting.  She is dyspneic.  She is a non-smoker, but is exposed to secondhand smoke.  No past medical history on file.  Patient Active Problem List   Diagnosis Date Noted  . Bug bites 03/27/2016  . Family history of diabetes mellitus 03/27/2016  . Family history of hypercholesterolemia 03/27/2016    Past Surgical History:  Procedure Laterality Date  . CHOLECYSTECTOMY    . ORTHOPEDIC SURGERY Left 2013   rotator cuff  . TUBAL LIGATION       OB History   None      Home Medications    Prior to Admission medications   Medication Sig Start Date End Date Taking? Authorizing Provider  azithromycin (ZITHROMAX) 250 MG tablet Take 1 tablet (250 mg total) by mouth daily. 04/02/18   Gilda Crease, MD  hydrOXYzine (ATARAX/VISTARIL) 25 MG tablet Take 1 tablet (25 mg total) by mouth every 6 (six) hours. Patient not taking: Reported on 04/02/2018 05/15/16   Joy, Ines Bloomer C, PA-C  ibuprofen (ADVIL,MOTRIN) 200 MG tablet Take 400 mg by mouth every 6 (six) hours as needed for headache, mild pain or moderate pain.    [provider]  ipratropium (ATROVENT) 0.06 % nasal spray  Place 2 sprays into both nostrils 4 (four) times daily. Patient not taking: Reported on 03/27/2016 01/29/15   Linna Hoff, MD  predniSONE (DELTASONE) 20 MG tablet Take 2 tablets (40 mg total) by mouth daily with breakfast. 04/02/18   Pollina, Canary Brim, MD    Family History Family History  Problem Relation Age of Onset  . Cancer Mother   . Stroke Mother   . Heart attack Mother   . Bipolar disorder Sister   . Stroke Brother   . Asthma Daughter   . Bipolar disorder Daughter   . ADD / ADHD Son   . Asperger's syndrome Son     Social History Social History   Tobacco Use  . Smoking status: Former Games developer  . Smokeless tobacco: Never Used  Substance Use Topics  . Alcohol use: No  . Drug use: No     Allergies   Oxycodone   Review of Systems Review of Systems  All other systems reviewed and are negative.    Physical Exam Updated Vital Signs BP (!) 190/87 (BP Location: Left Arm)   Pulse 67   Temp 97.9 F (36.6 C) (Oral)   Resp 18   Ht  (1.6 m)   Wt 89.4 kg (197 lb)   LMP 03/07/2018   SpO2 93%   BMI 34.90 kg/m   Physical Exam  Nursing note  and vitals reviewed.  52 year old female, resting comfortably and in no acute distress. Vital signs are significant for elevated blood pressure. Oxygen saturation is 93%, which is normal. Head is normocephalic and atraumatic. PERRLA, EOMI. Oropharynx is clear. Neck is nontender and supple without adenopathy or JVD. Back is nontender and there is no CVA tenderness. Lungs have coarse breath sounds without rales, wheezes, or rhonchi. Chest is nontender. Heart has regular rate and rhythm without murmur. Abdomen is soft, flat, nontender without masses or hepatosplenomegaly and peristalsis is normoactive. Extremities have trace edema, full range of motion is present. Skin is warm and dry without rash. Neurologic: Mental status is normal, cranial nerves are intact, there are no motor or sensory deficits.  ED Treatments /  Results  Labs (all labs ordered are listed, but only abnormal results are displayed) Labs Reviewed  BASIC METABOLIC PANEL - Abnormal; Notable for the following components:      Result Value   Glucose, Bld 163 (*)    Calcium 8.8 (*)    All other components within normal limits  URINALYSIS, ROUTINE W REFLEX MICROSCOPIC - Abnormal; Notable for the following components:   Color, Urine STRAW (*)    All other components within normal limits  CBG MONITORING, ED - Abnormal; Notable for the following components:   Glucose-Capillary 157 (*)    All other components within normal limits  CBC  I-STAT BETA HCG BLOOD, ED ([facility], [facility], [facility] ONLY)    EKG EKG Interpretation  Date/Time:  Saturday Apr 06 2018 16:31:45 EDT Ventricular Rate:  75 PR Interval:    QRS Duration: 83 QT Interval:  380 QTC Calculation: 425 R Axis:   70 Text Interpretation:  Sinus rhythm Normal ECG No old tracing to compare Confirmed by Dione Booze ([id number]) on 04/06/2018 11:04:28 PM   Radiology Dg Chest 2 View  Result Date: 04/06/2018 CLINICAL DATA:  Cough, syncope, ex-smoker EXAM: CHEST - 2 VIEW COMPARISON:  04/01/2018 FINDINGS: The heart size and mediastinal contours are within normal limits. Both lungs are clear. The visualized skeletal structures are unremarkable. IMPRESSION: No active cardiopulmonary disease. Electronically Signed   By: Tollie Eth M.D.   On: 04/06/2018 23:54    Procedures Procedures   Medications Ordered in ED Medications  albuterol (PROVENTIL HFA;VENTOLIN HFA) 108 (90 Base) MCG/ACT inhaler 2 puff (has no administration in time range)  amoxicillin (AMOXIL) capsule 1,000 mg (has no administration in time range)  ipratropium-albuterol (DUONEB) 0.5-2.5 (3) MG/3ML nebulizer solution 3 mL (3 mLs Nebulization Given 04/07/18 0006)     Initial Impression / Assessment and Plan / ED Course  I have reviewed the triage vital signs and the nursing notes.  Pertinent labs & imaging results that were available  during my care of the patient were reviewed by me and considered in my medical decision making (see chart for details).  Respiratory tract infection with cough and near syncopal episode today.  ED work-up is significant for elevated blood pressure.  ECG is normal.  Labs are unremarkable except for mildly elevated glucose which is probably related to prednisone administration.  Old records are reviewed confirming ED visit on May 7.  However, it was recorded that she was discharged with an albuterol inhaler.  Patient states that she never received the inhaler.  Will recheck chest x-ray and give albuterol with ipratropium via nebulizer.  This gave her relief at her last ED visit.  Chest x-ray shows no evidence of pneumonia.  She feels significantly better after nebulizer treatment  with albuterol and ipratropium.  She has taken her last dose of prednisone today.  She is discharged with a prescription for prednisone and is given an albuterol inhaler to take home.  Also, since she has failed to respond to the course of prednisone, will put her on antibiotics in spite of negative chest x-ray.  She is discharged with prescription for amoxicillin.  Advised to have blood pressure and blood sugar followed as an outpatient.  Final Clinical Impressions(s) / ED Diagnoses   Final diagnoses:  Bronchitis  Elevated blood pressure reading without diagnosis of hypertension  Hyperglycemia    ED Discharge Orders        Ordered    predniSONE (DELTASONE) 20 MG tablet  Daily with breakfast     04/07/18 0031    amoxicillin (AMOXIL) 500 MG capsule  2 times daily     04/07/18 0031       Dione Booze, MD 04/07/18 [phone]

## 2018-04-06 NOTE — ED Triage Notes (Signed)
Pt states that she was at work today walking around when she became very dizzy and had a near syncopal episode. Pt states she became very lightheaded nad flushed. Pt states that she is wheezing and coughing. Pt states that she cannot sleep because of the coughing. Pt states frequent urination. Pt states it felt like she ripped something in her chest from all the coughing.

## 2018-04-07 MED ORDER — ALBUTEROL SULFATE HFA 108 (90 BASE) MCG/ACT IN AERS
2.0000 | INHALATION_SPRAY | RESPIRATORY_TRACT | Status: DC | PRN
  Administered 2018-04-07: 2 via RESPIRATORY_TRACT
  Filled 2018-04-07: qty 6.7

## 2018-04-07 MED ORDER — PREDNISONE 20 MG PO TABS: 40.0000 mg | ORAL_TABLET | Freq: Every day | ORAL | 0 refills | Status: DC

## 2018-04-07 MED ORDER — AMOXICILLIN 500 MG PO CAPS
1000.0000 mg | ORAL_CAPSULE | Freq: Once | ORAL | Status: AC
  Administered 2018-04-07: 1000 mg via ORAL
  Filled 2018-04-07: qty 2

## 2018-04-07 MED ORDER — AMOXICILLIN 500 MG PO CAPS: 1000.0000 mg | ORAL_CAPSULE | Freq: Two times a day (BID) | ORAL | 0 refills | Status: DC

## 2018-04-07 NOTE — Discharge Instructions (Addendum)
Use the inhaler - two puffs every four hours - as needed for cough or difficulty breathing.  Your blood pressure was high today, and was also high when you were here last week.  Please have it checked several times over the next 2 weeks.  If it is consistently elevated, you will need to be on medication to control it.  Your blood sugar was slightly elevated today - 163.  This is probably because of the prednisone you have been taking.  However, your primary care provider should recheck your blood sugar over the next several weeks to make sure that you are not in the early stages of diabetes.

## 2018-05-06 ENCOUNTER — Ambulatory Visit (INDEPENDENT_AMBULATORY_CARE_PROVIDER_SITE_OTHER): Payer: Self-pay | Admitting: Family Medicine

## 2018-05-06 ENCOUNTER — Encounter: Payer: Self-pay | Admitting: Family Medicine

## 2018-05-06 VITALS — BP 142/90 | HR 80 | Temp 98.0°F | Ht 63.0 in | Wt 195.0 lb

## 2018-05-06 DIAGNOSIS — R319 Hematuria, unspecified: Secondary | ICD-10-CM

## 2018-05-06 DIAGNOSIS — M549 Dorsalgia, unspecified: Secondary | ICD-10-CM

## 2018-05-06 DIAGNOSIS — R0600 Dyspnea, unspecified: Secondary | ICD-10-CM

## 2018-05-06 DIAGNOSIS — N390 Urinary tract infection, site not specified: Secondary | ICD-10-CM

## 2018-05-06 DIAGNOSIS — Z0000 Encounter for general adult medical examination without abnormal findings: Secondary | ICD-10-CM

## 2018-05-06 DIAGNOSIS — R0989 Other specified symptoms and signs involving the circulatory and respiratory systems: Secondary | ICD-10-CM

## 2018-05-06 DIAGNOSIS — E559 Vitamin D deficiency, unspecified: Secondary | ICD-10-CM

## 2018-05-06 DIAGNOSIS — Z131 Encounter for screening for diabetes mellitus: Secondary | ICD-10-CM

## 2018-05-06 DIAGNOSIS — E781 Pure hyperglyceridemia: Secondary | ICD-10-CM

## 2018-05-06 DIAGNOSIS — R059 Cough, unspecified: Secondary | ICD-10-CM

## 2018-05-06 DIAGNOSIS — R0602 Shortness of breath: Secondary | ICD-10-CM

## 2018-05-06 DIAGNOSIS — Z09 Encounter for follow-up examination after completed treatment for conditions other than malignant neoplasm: Secondary | ICD-10-CM

## 2018-05-06 DIAGNOSIS — R0609 Other forms of dyspnea: Secondary | ICD-10-CM

## 2018-05-06 DIAGNOSIS — R42 Dizziness and giddiness: Secondary | ICD-10-CM

## 2018-05-06 DIAGNOSIS — Z7689 Persons encountering health services in other specified circumstances: Secondary | ICD-10-CM

## 2018-05-06 DIAGNOSIS — R05 Cough: Secondary | ICD-10-CM

## 2018-05-06 LAB — POCT URINALYSIS DIP (MANUAL ENTRY)
Glucose, UA: NEGATIVE mg/dL
Nitrite, UA: NEGATIVE
Protein Ur, POC: 100 mg/dL — AB
Spec Grav, UA: >=1.03 — AB (ref 1.010–1.025)
Urobilinogen, UA: 0.2 E.U./dL
pH, UA: 5 (ref 5.0–8.0)

## 2018-05-06 LAB — POCT GLYCOSYLATED HEMOGLOBIN (HGB A1C): Hemoglobin A1C: 5.6 % (ref 4.0–5.6)

## 2018-05-06 MED ORDER — CETIRIZINE HCL 10 MG PO TABS: 10.0000 mg | ORAL_TABLET | Freq: Every day | ORAL | 11 refills | Status: DC

## 2018-05-06 MED ORDER — ALBUTEROL SULFATE HFA 108 (90 BASE) MCG/ACT IN AERS
2.0000 | INHALATION_SPRAY | Freq: Four times a day (QID) | RESPIRATORY_TRACT | 3 refills | Status: DC | PRN
Start: 2018-05-06 — End: 2019-04-11

## 2018-05-06 MED ORDER — GUAIFENESIN ER 600 MG PO TB12: 600.0000 mg | ORAL_TABLET | Freq: Two times a day (BID) | ORAL | 3 refills | Status: DC

## 2018-05-06 MED ORDER — NITROFURANTOIN MONOHYD MACRO 100 MG PO CAPS: 100.0000 mg | ORAL_CAPSULE | Freq: Two times a day (BID) | ORAL | 0 refills | Status: DC

## 2018-05-06 MED ORDER — ALBUTEROL SULFATE (2.5 MG/3ML) 0.083% IN NEBU: 2.5000 mg | INHALATION_SOLUTION | Freq: Four times a day (QID) | RESPIRATORY_TRACT | 3 refills | Status: DC | PRN

## 2018-05-06 NOTE — Progress Notes (Signed)
New Patient Consultation                        Subjective:    Patient ID: Kathy Hamilton, female    DOB: [birth year], 52 y.o.   MRN: [id number]   PCP: Raliegh Ip, NP  Chief Complaint  Patient presents with  . Establish Care  . Cough    HPI  Kathy Hamilton has no pertinent history. She has had history of upper respiratory problems recently.   Current Status: She is here today to re-establish care. She was last seen at our office in 2017. He last visit to ED was on 04/02/2018, where she was diagnosed with CAP, which symptoms have resolved. She denies any signs and symptoms of infection today. She does report occasional cough and shortness of breath.   Her last Menstrual Cycle was on 05/04/2018. Her cycles usually lasts for 7 days, with medium flow and no cramping.  She has a history of Bronchitis and Asthma. She has been having mild dizziness and lightheadedness. She denies visual changes, headaches, and unsteadiness.   She denies any GI complaints. Denies anxiety.   She has mild chronic joint pain in her hands and knees, which she takes Acetaminophen for relief.   History reviewed. No pertinent past medical history.  Family History  Problem Relation Age of Onset  . Cancer Mother   . Stroke Mother   . Heart attack Mother   . Bipolar disorder Sister   . Stroke Brother   . Asthma Daughter   . Bipolar disorder Daughter   . ADD / ADHD Son   . Asperger's syndrome Son     Social History   Socioeconomic History  . Marital status: Widowed    Spouse name: Not on file  . Number of children: Not on file  . Years of education: Not on file  . Highest education level: Not on file  Occupational History  . Not on file  Social Needs  . Financial resource strain: Not on file  . Food insecurity:    Worry: Not on file    Inability: Not on file  . Transportation needs:    Medical: Not on file    Non-medical: Not on file  Tobacco Use  . Smoking status:  Former Games developer  . Smokeless tobacco: Never Used  Substance and Sexual Activity  . Alcohol use: No  . Drug use: No  . Sexual activity: Yes  Lifestyle  . Physical activity:    Days per week: Not on file    Minutes per session: Not on file  . Stress: Not on file  Relationships  . Social connections:    Talks on phone: Not on file    Gets together: Not on file    Attends religious service: Not on file    Active member of club or organization: Not on file    Attends meetings of clubs or organizations: Not on file    Relationship status: Not on file  . Intimate partner violence:    Fear of current or ex partner: Not on file    Emotionally abused: Not on file    Physically abused: Not on file    Forced sexual activity: Not on file  Other Topics Concern  . Not on file  Social History Narrative  . Not on file    Past Surgical History:  Procedure Laterality Date  . CHOLECYSTECTOMY    . ORTHOPEDIC SURGERY Left 2013  rotator cuff  . TUBAL LIGATION     Immunization History  Administered Date(s) Administered  . Tdap 03/04/2015    No outpatient medications have been marked as taking for the 05/06/18 encounter (Office Visit) with Kallie LocksStroud, Emmaly Leech M, FNP.    Allergies  Allergen Reactions  . Oxycodone     Unknown reaction     BP (!) 142/90 (BP Location: Left Arm, Patient Position: Sitting, Cuff Size: Large)   Pulse 80   Temp 98 F (36.7 C) (Oral)   Ht 5\' 3"  (1.6 m)   Wt 195 lb (88.5 kg)   LMP 05/04/2018   SpO2 96%   BMI 34.54 kg/m    Review of Systems  Constitutional: Negative.   HENT: Negative.   Eyes: Negative.   Respiratory: Positive for cough and shortness of breath.   Cardiovascular: Negative.   Endocrine: Negative.   Genitourinary: Negative.   Musculoskeletal: Positive for arthralgias (hands and knee).  Skin: Negative.   Allergic/Immunologic: Negative.   Neurological: Positive for dizziness and light-headedness.  Hematological: Negative.    Psychiatric/Behavioral: Negative.    Objective:   Physical Exam  Constitutional: She is oriented to person, place, and time. She appears well-developed and well-nourished.  HENT:  Head: Normocephalic and atraumatic.  Right Ear: External ear normal.  Left Ear: External ear normal.  Nose: Nose normal.  Mouth/Throat: Oropharynx is clear and moist.  Eyes: Pupils are equal, round, and reactive to light. Conjunctivae and EOM are normal.  Neck: Normal range of motion. Neck supple.  Cardiovascular: Normal rate, regular rhythm, normal heart sounds and intact distal pulses.  Pulmonary/Chest: Effort normal and breath sounds normal.  Cough and congestion.  Abdominal: Soft. Bowel sounds are normal. She exhibits distension.  Musculoskeletal: Normal range of motion.  Neurological: She is alert and oriented to person, place, and time.  Skin: Skin is warm and dry.  Psychiatric: She has a normal mood and affect. Her behavior is normal. Judgment normal.  Nursing note and vitals reviewed.  Assessment & Plan:   1. Encounter to establish care She has a history of upper respiratory nfections in the past few years. She has history of Bronchitis and Asthma. She does have chest congestion. We will treat her with Albuterol Inhalers, antihistamine and encourage her to use Albuterol Nebs as needed.   She has mild joint pain, which she is taking Acetaminophen sparingly, not to exceed 4,000 mg daily.    She is positive for UTI today. We will send Rx for Macrobid to pharmacy today. We will order Urine Culture to further evaluate.   We will continue to monitor.   2. Screening for diabetes mellitus Hgb A1c is normal today at 5.6.  - POCT glycosylated hemoglobin (Hb A1C) - POCT urinalysis dipstick  3. Shortness of breath - albuterol (PROVENTIL HFA;VENTOLIN HFA) 108 (90 Base) MCG/ACT inhaler; Inhale 2 puffs into the lungs every 6 (six) hours as needed for wheezing or shortness of breath.  Dispense: 1 Inhaler;  Refill: 3 - albuterol (PROVENTIL) (2.5 MG/3ML) 0.083% nebulizer solution; Take 3 mLs (2.5 mg total) by nebulization every 6 (six) hours as needed for wheezing or shortness of breath.  Dispense: 75 mL; Refill: 3 - cetirizine (ZYRTEC) 10 MG tablet; Take 1 tablet (10 mg total) by mouth daily.  Dispense: 30 tablet; Refill: 11  4. Cough - albuterol (PROVENTIL HFA;VENTOLIN HFA) 108 (90 Base) MCG/ACT inhaler; Inhale 2 puffs into the lungs every 6 (six) hours as needed for wheezing or shortness of breath.  Dispense:  1 Inhaler; Refill: 3 - albuterol (PROVENTIL) (2.5 MG/3ML) 0.083% nebulizer solution; Take 3 mLs (2.5 mg total) by nebulization every 6 (six) hours as needed for wheezing or shortness of breath.  Dispense: 75 mL; Refill: 3 - guaiFENesin (MUCINEX) 600 MG 12 hr tablet; Take 1 tablet (600 mg total) by mouth 2 (two) times daily.  Dispense: 60 tablet; Refill: 3 - cetirizine (ZYRTEC) 10 MG tablet; Take 1 tablet (10 mg total) by mouth daily.  Dispense: 30 tablet; Refill: 11  5. Dyspnea on exertion - albuterol (PROVENTIL HFA;VENTOLIN HFA) 108 (90 Base) MCG/ACT inhaler; Inhale 2 puffs into the lungs every 6 (six) hours as needed for wheezing or shortness of breath.  Dispense: 1 Inhaler; Refill: 3 - albuterol (PROVENTIL) (2.5 MG/3ML) 0.083% nebulizer solution; Take 3 mLs (2.5 mg total) by nebulization every 6 (six) hours as needed for wheezing or shortness of breath.  Dispense: 75 mL; Refill: 3 - cetirizine (ZYRTEC) 10 MG tablet; Take 1 tablet (10 mg total) by mouth daily.  Dispense: 30 tablet; Refill: 11  6. Dizziness Stable today. Ambulate slowly. She will report in symptom increase.   7. Lightheaded Mild. She will ambulate slowly when changing positions.   8. Health care maintenance - CBC with Differential - Comprehensive metabolic panel - TSH - Lipid Panel - Vitamin D, 25-hydroxy - Vitamin B12  9. Urinary tract infection with hematuria, site unspecified Abnormal Urinalysis. Results are  pending.  - Urine Culture  10. Chest congestion We will send Rx for Mucinex to pharmacy today.   - albuterol (PROVENTIL HFA;VENTOLIN HFA) 108 (90 Base) MCG/ACT inhaler; Inhale 2 puffs into the lungs every 6 (six) hours as needed for wheezing or shortness of breath.  Dispense: 1 Inhaler; Refill: 3 - albuterol (PROVENTIL) (2.5 MG/3ML) 0.083% nebulizer solution; Take 3 mLs (2.5 mg total) by nebulization every 6 (six) hours as needed for wheezing or shortness of breath.  Dispense: 75 mL; Refill: 3 - cetirizine (ZYRTEC) 10 MG tablet; Take 1 tablet (10 mg total) by mouth daily.  Dispense: 30 tablet; Refill: 11   11. Upper back pain on right side Continue Acetaminophen as directed.   12. Follow up She will follow up in 1 month.  Meds ordered this encounter  Medications  . albuterol (PROVENTIL HFA;VENTOLIN HFA) 108 (90 Base) MCG/ACT inhaler    Sig: Inhale 2 puffs into the lungs every 6 (six) hours as needed for wheezing or shortness of breath.    Dispense:  1 Inhaler    Refill:  3  . albuterol (PROVENTIL) (2.5 MG/3ML) 0.083% nebulizer solution    Sig: Take 3 mLs (2.5 mg total) by nebulization every 6 (six) hours as needed for wheezing or shortness of breath.    Dispense:  75 mL    Refill:  3  . guaiFENesin (MUCINEX) 600 MG 12 hr tablet    Sig: Take 1 tablet (600 mg total) by mouth 2 (two) times daily.    Dispense:  60 tablet    Refill:  3  . cetirizine (ZYRTEC) 10 MG tablet    Sig: Take 1 tablet (10 mg total) by mouth daily.    Dispense:  30 tablet    Refill:  11  . nitrofurantoin, macrocrystal-monohydrate, (MACROBID) 100 MG capsule    Sig: Take 1 capsule (100 mg total) by mouth 2 (two) times daily.    Dispense:  14 capsule    Refill:  0    Raliegh Ip,  MSN, FNP-BC [facility]  [address] [place], [place] [address] [phone]

## 2018-05-06 NOTE — Patient Instructions (Addendum)
Albuterol inhalation aerosol What is this medicine? ALBUTEROL (al BYOO ter ole) is a bronchodilator. It helps open up the airways in your lungs to make it easier to breathe. This medicine is used to treat and to prevent bronchospasm. This medicine may be used for other purposes; ask your health care provider or pharmacist if you have questions. COMMON BRAND NAME(S): Proair HFA, Proventil, Proventil HFA, Respirol, Ventolin, Ventolin HFA What should I tell my health care provider before I take this medicine? They need to know if you have any of the following conditions: -diabetes -heart disease or irregular heartbeat -high blood pressure -pheochromocytoma -seizures -thyroid disease -an unusual or allergic reaction to albuterol, levalbuterol, sulfites, other medicines, foods, dyes, or preservatives -pregnant or trying to get pregnant -breast-feeding How should I use this medicine? This medicine is for inhalation through the mouth. Follow the directions on your prescription label. Take your medicine at regular intervals. Do not use more often than directed. Make sure that you are using your inhaler correctly. Ask you doctor or health care provider if you have any questions. Talk to your pediatrician regarding the use of this medicine in children. Special care may be needed. Overdosage: If you think you have taken too much of this medicine contact a poison control center or emergency room at once. NOTE: This medicine is only for you. Do not share this medicine with others. What if I miss a dose? If you miss a dose, use it as soon as you can. If it is almost time for your next dose, use only that dose. Do not use double or extra doses. What may interact with this medicine? -anti-infectives like chloroquine and pentamidine -caffeine -cisapride -diuretics -medicines for colds -medicines for depression or for emotional or psychotic conditions -medicines for weight loss including some herbal  products -methadone -some antibiotics like clarithromycin, erythromycin, levofloxacin, and linezolid -some heart medicines -steroid hormones like dexamethasone, cortisone, hydrocortisone -theophylline -thyroid hormones This list may not describe all possible interactions. Give your health care provider a list of all the medicines, herbs, non-prescription drugs, or dietary supplements you use. Also tell them if you smoke, drink alcohol, or use illegal drugs. Some items may interact with your medicine. What should I watch for while using this medicine? Tell your doctor or health care professional if your symptoms do not improve. Do not use extra albuterol. If your asthma or bronchitis gets worse while you are using this medicine, call your doctor right away. If your mouth gets dry try chewing sugarless gum or sucking hard candy. Drink water as directed. What side effects may I notice from receiving this medicine? Side effects that you should report to your doctor or health care professional as soon as possible: -allergic reactions like skin rash, itching or hives, swelling of the face, lips, or tongue -breathing problems -chest pain -feeling faint or lightheaded, falls -high blood pressure -irregular heartbeat -fever -muscle cramps or weakness -pain, tingling, numbness in the hands or feet -vomiting Side effects that usually do not require medical attention (report to your doctor or health care professional if they continue or are bothersome): -cough -difficulty sleeping -headache -nervousness or trembling -stomach upset -stuffy or runny nose -throat irritation -unusual taste This list may not describe all possible side effects. Call your doctor for medical advice about side effects. You may report side effects to FDA at 1-800-FDA-1088. Where should I keep my medicine? Keep out of the reach of children. Store at room temperature between 15 and 30 degrees   C (59 and 86 degrees F). The  contents are under pressure and may burst when exposed to heat or flame. Do not freeze. This medicine does not work as well if it is too cold. Throw away any unused medicine after the expiration date. Inhalers need to be thrown away after the labeled number of puffs have been used or by the expiration date; whichever comes first. Ventolin HFA should be thrown away 12 months after removing from foil pouch. Check the instructions that come with your medicine. NOTE: This sheet is a summary. It may not cover all possible information. If you have questions about this medicine, talk to your doctor, pharmacist, or health care provider.  2018 Elsevier/Gold Standard (2013-05-01 10:57:17)  Guaifenesin oral ER tablets What is this medicine? GUAIFENESIN (gwye FEN e sin) is an expectorant. It helps to thin mucous and make coughs more productive. This medicine is used to treat coughs caused by colds or the flu. It is not intended to treat chronic cough caused by smoking, asthma, emphysema, or heart failure. This medicine may be used for other purposes; ask your health care provider or pharmacist if you have questions. COMMON BRAND NAME(S): Humibid, Mucinex What should I tell my health care provider before I take this medicine? They need to know if you have any of these conditions: -fever -kidney disease -an unusual or allergic reaction to guaifenesin, other medicines, foods, dyes, or preservatives -pregnant or trying to get pregnant -breast-feeding How should I use this medicine? Take this medicine by mouth with a full glass of water. Follow the directions on the prescription label. Do not break, chew or crush this medicine. You may take with food or on an empty stomach. Take your medicine at regular intervals. Do not take your medicine more often than directed. Talk to your pediatrician regarding the use of this medicine in children. While this drug may be prescribed for children as young as 16 years old for  selected conditions, precautions do apply. Overdosage: If you think you have taken too much of this medicine contact a poison control center or emergency room at once. NOTE: This medicine is only for you. Do not share this medicine with others. What if I miss a dose? If you miss a dose, take it as soon as you can. If it is almost time for your next dose, take only that dose. Do not take double or extra doses. What may interact with this medicine? Interactions are not expected. This list may not describe all possible interactions. Give your health care provider a list of all the medicines, herbs, non-prescription drugs, or dietary supplements you use. Also tell them if you smoke, drink alcohol, or use illegal drugs. Some items may interact with your medicine. What should I watch for while using this medicine? Do not treat a cough for more than 1 week without consulting your doctor or health care professional. If you also have a high fever, skin rash, continuing headache, or sore throat, see your doctor. For best results, drink 6 to 8 glasses water daily while you are taking this medicine. What side effects may I notice from receiving this medicine? Side effects that you should report to your doctor or health care professional as soon as possible: -allergic reactions like skin rash, itching or hives, swelling of the face, lips, or tongue Side effects that usually do not require medical attention (report to your doctor or health care professional if they continue or are bothersome): -dizziness -headache -stomach upset  This list may not describe all possible side effects. Call your doctor for medical advice about side effects. You may report side effects to FDA at 1-800-FDA-1088. Where should I keep my medicine? Keep out of the reach of children. Store at room temperature between 20 and 25 degrees C (68 and 77 degrees F). Keep container tightly closed. Throw away any unused medicine after the  expiration date. NOTE: This sheet is a summary. It may not cover all possible information. If you have questions about this medicine, talk to your doctor, pharmacist, or health care provider.  2018 Elsevier/Gold Standard (2008-03-25 12:14:14)  Nitrofurantoin tablets or capsules What is this medicine? NITROFURANTOIN (nye troe fyoor AN toyn) is an antibiotic. It is used to treat urinary tract infections. This medicine may be used for other purposes; ask your health care provider or pharmacist if you have questions. COMMON BRAND NAME(S): Macrobid, Macrodantin, Urotoin What should I tell my health care provider before I take this medicine? They need to know if you have any of these conditions: -anemia -diabetes -glucose-6-phosphate dehydrogenase deficiency -kidney disease -liver disease -lung disease -other chronic illness -an unusual or allergic reaction to nitrofurantoin, other antibiotics, other medicines, foods, dyes or preservatives -pregnant or trying to get pregnant -breast-feeding How should I use this medicine? Take this medicine by mouth with a glass of water. Follow the directions on the prescription label. Take this medicine with food or milk. Take your doses at regular intervals. Do not take your medicine more often than directed. Do not stop taking except on your doctor's advice. Talk to your pediatrician regarding the use of this medicine in children. While this drug may be prescribed for selected conditions, precautions do apply. Overdosage: If you think you have taken too much of this medicine contact a poison control center or emergency room at once. NOTE: This medicine is only for you. Do not share this medicine with others. What if I miss a dose? If you miss a dose, take it as soon as you can. If it is almost time for your next dose, take only that dose. Do not take double or extra doses. What may interact with this medicine? -antacids containing magnesium  trisilicate -probenecid -quinolone antibiotics like ciprofloxacin, lomefloxacin, norfloxacin and ofloxacin -sulfinpyrazone This list may not describe all possible interactions. Give your health care provider a list of all the medicines, herbs, non-prescription drugs, or dietary supplements you use. Also tell them if you smoke, drink alcohol, or use illegal drugs. Some items may interact with your medicine. What should I watch for while using this medicine? Tell your doctor or health care professional if your symptoms do not improve or if you get new symptoms. Drink several glasses of water a day. If you are taking this medicine for a long time, visit your doctor for regular checks on your progress. If you are diabetic, you may get a false positive result for sugar in your urine with certain brands of urine tests. Check with your doctor. What side effects may I notice from receiving this medicine? Side effects that you should report to your doctor or health care professional as soon as possible: -allergic reactions like skin rash or hives, swelling of the face, lips, or tongue -chest pain -cough -difficulty breathing -dizziness, drowsiness -fever or infection -joint aches or pains -pale or blue-tinted skin -redness, blistering, peeling or loosening of the skin, including inside the mouth -tingling, burning, pain, or numbness in hands or feet -unusual bleeding or bruising -unusually weak  or tired -yellowing of eyes or skin Side effects that usually do not require medical attention (report to your doctor or health care professional if they continue or are bothersome): -dark urine -diarrhea -headache -loss of appetite -nausea or vomiting -temporary hair loss This list may not describe all possible side effects. Call your doctor for medical advice about side effects. You may report side effects to FDA at 1-800-FDA-1088. Where should I keep my medicine? Keep out of the reach of  children. Store at room temperature between 15 and 30 degrees C (59 and 86 degrees F). Protect from light. Throw away any unused medicine after the expiration date. NOTE: This sheet is a summary. It may not cover all possible information. If you have questions about this medicine, talk to your doctor, pharmacist, or health care provider.  2018 Elsevier/Gold Standard (2008-06-03 15:56:47)  Cetirizine tablets What is this medicine? CETIRIZINE (se TI ra zeen) is an antihistamine. This medicine is used to treat or prevent symptoms of allergies. It is also used to help reduce itchy skin rash and hives. This medicine may be used for other purposes; ask your health care provider or pharmacist if you have questions. COMMON BRAND NAME(S): All Day Allergy, Zyrtec, Zyrtec Hives Relief What should I tell my health care provider before I take this medicine? They need to know if you have any of these conditions: -kidney disease -liver disease -an unusual or allergic reaction to cetirizine, hydroxyzine, other medicines, foods, dyes, or preservatives -pregnant or trying to get pregnant -breast-feeding How should I use this medicine? Take this medicine by mouth with a glass of water. Follow the directions on the prescription label. You can take this medicine with food or on an empty stomach. Take your medicine at regular times. Do not take more often than directed. You may need to take this medicine for several days before your symptoms improve. Talk to your pediatrician regarding the use of this medicine in children. Special care may be needed. While this drug may be prescribed for children as young as 726 years of age for selected conditions, precautions do apply. Overdosage: If you think you have taken too much of this medicine contact a poison control center or emergency room at once. NOTE: This medicine is only for you. Do not share this medicine with others. What if I miss a dose? If you miss a dose, take  it as soon as you can. If it is almost time for your next dose, take only that dose. Do not take double or extra doses. What may interact with this medicine? -alcohol -certain medicines for anxiety or sleep -narcotic medicines for pain -other medicines for colds or allergies This list may not describe all possible interactions. Give your health care provider a list of all the medicines, herbs, non-prescription drugs, or dietary supplements you use. Also tell them if you smoke, drink alcohol, or use illegal drugs. Some items may interact with your medicine. What should I watch for while using this medicine? Visit your doctor or health care professional for regular checks on your health. Tell your doctor if your symptoms do not improve. You may get drowsy or dizzy. Do not drive, use machinery, or do anything that needs mental alertness until you know how this medicine affects you. Do not stand or sit up quickly, especially if you are an older patient. This reduces the risk of dizzy or fainting spells. Your mouth may get dry. Chewing sugarless gum or sucking hard candy, and drinking  plenty of water may help. Contact your doctor if the problem does not go away or is severe. What side effects may I notice from receiving this medicine? Side effects that you should report to your doctor or health care professional as soon as possible: -allergic reactions like skin rash, itching or hives, swelling of the face, lips, or tongue -changes in vision or hearing -fast or irregular heartbeat -trouble passing urine or change in the amount of urine Side effects that usually do not require medical attention (report to your doctor or health care professional if they continue or are bothersome): -dizziness -dry mouth -irritability -sore throat -stomach pain -tiredness This list may not describe all possible side effects. Call your doctor for medical advice about side effects. You may report side effects to FDA at  1-800-FDA-1088. Where should I keep my medicine? Keep out of the reach of children. Store at room temperature between 15 and 30 degrees C (59 and 86 degrees F). Throw away any unused medicine after the expiration date. NOTE: This sheet is a summary. It may not cover all possible information. If you have questions about this medicine, talk to your doctor, pharmacist, or health care provider.  2018 Elsevier/Gold Standard (2014-12-08 13:44:42)

## 2018-05-07 LAB — COMPREHENSIVE METABOLIC PANEL
ALT: 11 IU/L (ref 0–32)
AST: 13 IU/L (ref 0–40)
Albumin/Globulin Ratio: 1.8 (ref 1.2–2.2)
Albumin: 4.3 g/dL (ref 3.5–5.5)
Alkaline Phosphatase: 68 IU/L (ref 39–117)
BUN/Creatinine Ratio: 17 (ref 9–23)
BUN: 11 mg/dL (ref 6–24)
Bilirubin Total: 0.3 mg/dL (ref 0.0–1.2)
CO2: 20 mmol/L (ref 20–29)
Calcium: 8.9 mg/dL (ref 8.7–10.2)
Chloride: 106 mmol/L (ref 96–106)
Creatinine, Ser: 0.63 mg/dL (ref 0.57–1.00)
GFR calc Af Amer: 120 mL/min/{1.73_m2} (ref >59)
GFR calc non Af Amer: 104 mL/min/{1.73_m2} (ref >59)
Globulin, Total: 2.4 g/dL (ref 1.5–4.5)
Glucose: 96 mg/dL (ref 65–99)
Potassium: 4.5 mmol/L (ref 3.5–5.2)
Sodium: 140 mmol/L (ref 134–144)
Total Protein: 6.7 g/dL (ref 6.0–8.5)

## 2018-05-07 LAB — LIPID PANEL
Chol/HDL Ratio: 4 ratio (ref 0.0–4.4)
Cholesterol, Total: 178 mg/dL (ref 100–199)
HDL: 44 mg/dL (ref >39)
LDL Calculated: 96 mg/dL (ref 0–99)
Triglycerides: 189 mg/dL — ABNORMAL HIGH (ref 0–149)
VLDL Cholesterol Cal: 38 mg/dL (ref 5–40)

## 2018-05-07 LAB — CBC WITH DIFFERENTIAL/PLATELET
Basophils Absolute: 0 10*3/uL (ref 0.0–0.2)
Basos: 1 %
EOS (ABSOLUTE): 0.2 10*3/uL (ref 0.0–0.4)
Eos: 2 %
Hematocrit: 41.4 % (ref 34.0–46.6)
Hemoglobin: 12.9 g/dL (ref 11.1–15.9)
Immature Grans (Abs): 0 10*3/uL (ref 0.0–0.1)
Immature Granulocytes: 0 %
Lymphocytes Absolute: 2.8 10*3/uL (ref 0.7–3.1)
Lymphs: 36 %
MCH: 28.4 pg (ref 26.6–33.0)
MCHC: 31.2 g/dL — ABNORMAL LOW (ref 31.5–35.7)
MCV: 91 fL (ref 79–97)
Monocytes Absolute: 0.5 10*3/uL (ref 0.1–0.9)
Monocytes: 6 %
Neutrophils Absolute: 4.3 10*3/uL (ref 1.4–7.0)
Neutrophils: 55 %
Platelets: 387 10*3/uL (ref 150–450)
RBC: 4.54 x10E6/uL (ref 3.77–5.28)
RDW: 14.3 % (ref 12.3–15.4)
WBC: 7.9 10*3/uL (ref 3.4–10.8)

## 2018-05-07 LAB — VITAMIN B12: Vitamin B-12: 1111 pg/mL (ref 232–1245)

## 2018-05-07 LAB — VITAMIN D 25 HYDROXY (VIT D DEFICIENCY, FRACTURES): Vit D, 25-Hydroxy: 12.7 ng/mL — ABNORMAL LOW (ref 30.0–100.0)

## 2018-05-07 LAB — TSH: TSH: 2.03 u[IU]/mL (ref 0.450–4.500)

## 2018-05-08 LAB — URINE CULTURE

## 2018-05-08 MED ORDER — VITAMIN D (ERGOCALCIFEROL) 1.25 MG (50000 UNIT) PO CAPS: 50000.0000 [IU] | ORAL_CAPSULE | ORAL | 1 refills | Status: DC

## 2018-05-08 MED ORDER — GEMFIBROZIL 600 MG PO TABS: 600.0000 mg | ORAL_TABLET | Freq: Two times a day (BID) | ORAL | 1 refills | Status: DC

## 2018-05-08 NOTE — Addendum Note (Signed)
Addended by: Kallie LocksSTROUD, Nozomi Mettler M on: 05/08/2018 05:12 PM   Modules accepted: Orders

## 2018-05-10 ENCOUNTER — Telehealth: Payer: Self-pay

## 2018-05-10 NOTE — Telephone Encounter (Signed)
Patient notified

## 2018-05-10 NOTE — Telephone Encounter (Signed)
-----   Message from Kallie LocksNatalie M Stroud, FNP sent at 05/08/2018  5:02 PM EDT ----- Regarding: "Lab Results"====2ND MESSAGE!! Kathy LuoHey Carrie!!!  Sorry, meant to leave other lab result instructions for you!  Please let patient know that I am also sending Rxs for:  1) Lopid (her Triglycerides are elevated). 2) Vitamin D (her Vitamin D level is VERY low!)  Her Thyroid, Vitamin B-12, CMET, and CBC are all normal.  Thanks!

## 2018-05-10 NOTE — Telephone Encounter (Signed)
-----   Message from Kallie LocksNatalie M Stroud, FNP sent at 05/08/2018  4:50 PM EDT ----- Regarding: "Lab Results" Lyla Sonarrie,   Please call patient to make sure she picked up from pharmacy: 1) Mucinex 2) Albuterol 3) Albuterol Nebs 4) Zyrtec 5) Macrobid  He Urine Culture was negative.   Please remind her to take as directed and be sure to complete all antibiotic. She should call office if she has any acute symptoms.    Thank you!

## 2018-05-10 NOTE — Telephone Encounter (Signed)
Patient notified and will pick-up medications

## 2018-06-05 ENCOUNTER — Ambulatory Visit (INDEPENDENT_AMBULATORY_CARE_PROVIDER_SITE_OTHER): Payer: Self-pay | Admitting: Family Medicine

## 2018-06-05 ENCOUNTER — Encounter: Payer: Self-pay | Admitting: Family Medicine

## 2018-06-05 VITALS — BP 142/78 | HR 70 | Temp 98.2°F | Ht 63.0 in | Wt 195.0 lb

## 2018-06-05 DIAGNOSIS — R0602 Shortness of breath: Secondary | ICD-10-CM

## 2018-06-05 DIAGNOSIS — R0600 Dyspnea, unspecified: Secondary | ICD-10-CM

## 2018-06-05 DIAGNOSIS — R059 Cough, unspecified: Secondary | ICD-10-CM

## 2018-06-05 DIAGNOSIS — R0609 Other forms of dyspnea: Secondary | ICD-10-CM

## 2018-06-05 DIAGNOSIS — Z09 Encounter for follow-up examination after completed treatment for conditions other than malignant neoplasm: Secondary | ICD-10-CM

## 2018-06-05 DIAGNOSIS — R05 Cough: Secondary | ICD-10-CM

## 2018-06-05 NOTE — Progress Notes (Signed)
Follow Up  Subjective:    Patient ID: Kathy Hamilton, female    DOB: 06-09-1966, 52 y.o.   MRN: [id number]   PCP: Raliegh Ip, NP  Chief Complaint  Patient presents with  . Follow-up    chronic condition    HPI  Kathy Hamilton has a past medical history of upper respiratory problems. She is here today for follow up.   Current Status: Since her last office visit, she has occasional cough and shortness of breath. No chest pain, heart palpitations.  She denies fevers, chills, fatigue, recent infections, weight loss. She does have hot flashes.   She has not had any headaches, visual changes, dizziness, and falls.   No reports of GI problems such as nausea, vomiting, diarrhea, and constipation. She has no reports of blood in stools, dysuria and hematuria.   No depression or anxiety.   She has joint pain in her knees and hands today.   No past medical history on file.  Family History  Problem Relation Age of Onset  . Cancer Mother   . Stroke Mother   . Heart attack Mother   . Bipolar disorder Sister   . Stroke Brother   . Asthma Daughter   . Bipolar disorder Daughter   . ADD / ADHD Son   . Asperger's syndrome Son     Social History   Socioeconomic History  . Marital status: Widowed    Spouse name: Not on file  . Number of children: Not on file  . Years of education: Not on file  . Highest education level: Not on file  Occupational History  . Not on file  Social Needs  . Financial resource strain: Not on file  . Food insecurity:    Worry: Not on file    Inability: Not on file  . Transportation needs:    Medical: Not on file    Non-medical: Not on file  Tobacco Use  . Smoking status: Former Games developer  . Smokeless tobacco: Never Used  Substance and Sexual Activity  . Alcohol use: No  . Drug use: No  . Sexual activity: Yes  Lifestyle  . Physical activity:    Days per week: Not on file    Minutes per session: Not on file  . Stress: Not on file  Relationships  .  Social connections:    Talks on phone: Not on file    Gets together: Not on file    Attends religious service: Not on file    Active member of club or organization: Not on file    Attends meetings of clubs or organizations: Not on file    Relationship status: Not on file  . Intimate partner violence:    Fear of current or ex partner: Not on file    Emotionally abused: Not on file    Physically abused: Not on file    Forced sexual activity: Not on file  Other Topics Concern  . Not on file  Social History Narrative  . Not on file    Past Surgical History:  Procedure Laterality Date  . CHOLECYSTECTOMY    . ORTHOPEDIC SURGERY Left 2013   rotator cuff  . TUBAL LIGATION     Immunization History  Administered Date(s) Administered  . Tdap 03/04/2015    Current Meds  Medication Sig  . albuterol (PROVENTIL HFA;VENTOLIN HFA) 108 (90 Base) MCG/ACT inhaler Inhale 2 puffs into the lungs every 6 (six) hours as needed for wheezing or shortness of breath.  Marland Kitchen  albuterol (PROVENTIL) (2.5 MG/3ML) 0.083% nebulizer solution Take 3 mLs (2.5 mg total) by nebulization every 6 (six) hours as needed for wheezing or shortness of breath.  . cetirizine (ZYRTEC) 10 MG tablet Take 1 tablet (10 mg total) by mouth daily.  Marland Kitchen. gemfibrozil (LOPID) 600 MG tablet Take 1 tablet (600 mg total) by mouth 2 (two) times daily before a meal.  . guaiFENesin (MUCINEX) 600 MG 12 hr tablet Take 1 tablet (600 mg total) by mouth 2 (two) times daily.  . Vitamin D, Ergocalciferol, (DRISDOL) 50000 units CAPS capsule Take 1 capsule (50,000 Units total) by mouth every 7 (seven) days.  . [DISCONTINUED] nitrofurantoin, macrocrystal-monohydrate, (MACROBID) 100 MG capsule Take 1 capsule (100 mg total) by mouth 2 (two) times daily.    Allergies  Allergen Reactions  . Oxycodone     Unknown reaction     BP (!) 142/78 (BP Location: Left Arm, Patient Position: Sitting, Cuff Size: Large)   Pulse 70   Temp 98.2 F (36.8 C) (Oral)   Ht  5\' 3"  (1.6 m)   Wt 195 lb (88.5 kg)   LMP 05/04/2018   SpO2 100%   BMI 34.54 kg/m   Review of Systems  Constitutional: Negative.   HENT: Negative.   Eyes: Negative.   Respiratory: Positive for cough and shortness of breath.   Cardiovascular: Negative.   Gastrointestinal: Negative.   Endocrine: Negative.   Genitourinary: Negative.   Musculoskeletal: Positive for arthralgias (knees and hands).  Skin: Negative.   Allergic/Immunologic: Negative.   Neurological:       Hot flashes  Hematological: Negative.   Psychiatric/Behavioral: Negative.    Objective:   Physical Exam  Constitutional: She is oriented to person, place, and time. She appears well-developed and well-nourished.  HENT:  Head: Normocephalic and atraumatic.  Right Ear: External ear normal.  Left Ear: External ear normal.  Nose: Nose normal.  Mouth/Throat: Oropharynx is clear and moist.  Eyes: Pupils are equal, round, and reactive to light. Conjunctivae and EOM are normal.  Neck: Normal range of motion. Neck supple.  Cardiovascular: Normal rate, regular rhythm, normal heart sounds and intact distal pulses.  Pulmonary/Chest: Effort normal and breath sounds normal.  Abdominal: Soft. Bowel sounds are normal.  Musculoskeletal: Normal range of motion.  Neurological: She is alert and oriented to person, place, and time.  Skin: Skin is warm and dry. Capillary refill takes less than 2 seconds.  Psychiatric: She has a normal mood and affect. Her behavior is normal. Judgment and thought content normal.  Nursing note and vitals reviewed.  Assessment & Plan:   1. Shortness of breath We will add long-acting inhaler. She will continue to use Albuterol nebs adm Zyrtec as prescribed. She will continue to use Albuterol inhaler as needed. We will re-evaluate at next office visit if she needs to referred to Pulmonology for further evaluation.  - budesonide-formoterol (SYMBICORT) 80-4.5 MCG/ACT inhaler; Inhale 2 puffs into the lungs  2 (two) times daily.  Dispense: 1 Inhaler; Refill: 3  2. Cough Stable. Not worsening.  - budesonide-formoterol (SYMBICORT) 80-4.5 MCG/ACT inhaler; Inhale 2 puffs into the lungs 2 (two) times daily.  Dispense: 1 Inhaler; Refill: 3  3. Dyspnea on exertion - budesonide-formoterol (SYMBICORT) 80-4.5 MCG/ACT inhaler; Inhale 2 puffs into the lungs 2 (two) times daily.  Dispense: 1 Inhaler; Refill: 3  4. Follow up She will follow up in 2 weeks.   Meds ordered this encounter  Medications  . budesonide-formoterol (SYMBICORT) 80-4.5 MCG/ACT inhaler    Sig:  Inhale 2 puffs into the lungs 2 (two) times daily.    Dispense:  1 Inhaler    Refill:  3    Raliegh Ip,  MSN, FNP-C [facility] [address] [place], [place] [address] [phone]

## 2018-06-05 NOTE — Patient Instructions (Signed)
Budesonide; Formoterol Inhalation What is this medicine? BUDESONIDE; FORMOTEROL (byoo DES oh nide; for [place] te [place]) inhalation is a combination of 2 medicines that decrease inflammation and help to open up the airways in your lungs. It is used to treat asthma. It is also used to treat chronic obstructive pulmonary disease (COPD), including chronic bronchitis or emphysema. Do NOT use for an acute asthma or COPD attack. This medicine may be used for other purposes; ask your health care provider or pharmacist if you have questions. COMMON BRAND NAME(S): Symbicort What should I tell my health care provider before I take this medicine? They need to know if you have any of these conditions: -bone problems -diabetes -heart disease or irregular heartbeat -high blood pressure -immune system problems -infection -liver disease -worsening asthma -an unusual or allergic reaction to budesonide, formoterol, medicines, foods, dyes, or preservatives -pregnant or trying to get pregnant -breast-feeding How should I use this medicine? This medicine is inhaled through the mouth. Rinse your mouth with water after use. Make sure not to swallow the water. Follow the directions on your prescription label. Do not use more often than directed. Do not stop taking except on your doctor's advice. Make sure that you are using your inhaler correctly. Ask your doctor or health care provider if you have any questions. A special MedGuide will be given to you by the pharmacist with each prescription and refill. Be sure to read this information carefully each time. Talk to your pediatrician regarding the use of this medicine in children. While this drug may be prescribed for children as young as 60 years of age for selected conditions, precautions do apply. Overdosage: If you think you have taken too much of this medicine contact a poison control center or emergency room at once. NOTE: This medicine is only for you. Do not share  this medicine with others. What if I miss a dose? If you miss a dose, use it as soon as you remember. If it is almost time for your next dose, use only that dose and continue with your regular schedule, spacing doses evenly. Do not use double or extra doses. What may interact with this medicine? Do not take this medicine with any of the following medications: -MAOIs like Carbex, Eldepryl, Marplan, Nardil, and Parnate -mifepristone -probucol -procarbazine -some other medicines for asthma like formoterol, salmeterol This medicine may also interact with the following medications: -antibiotics like clarithromycin, erythromycin -cimetidine -diuretics -grapefruit juice -itraconazole -ketoconazole -medicines for depression, anxiety, or psychotic disturbances -medicines for irregular heartbeat -methadone -some heart medicines like atenolol, metoprolol -some other medicines for breathing problems -some vaccines This list may not describe all possible interactions. Give your health care provider a list of all the medicines, herbs, non-prescription drugs, or dietary supplements you use. Also tell them if you smoke, drink alcohol, or use illegal drugs. Some items may interact with your medicine. What should I watch for while using this medicine? Tell your doctor or health care professional if your symptoms do not improve or get worse. Do not use this medicine more than every 12 hours. NEVER use this medicine for an acute asthma or COPD attack. You should use your short-acting rescue inhalers for this purpose. If your symptoms get worse or if you need your short-acting inhalers more often, call your doctor right away. This medicine may increase your risk of getting an infection. Tell your doctor or health care professional if you are around anyone with measles or chickenpox, or if you develop  sores or blisters that do not heal properly. What side effects may I notice from receiving this  medicine? Side effects that you should report to your doctor or health care professional as soon as possible: -allergic reactions such as skin rash or itching, hives, swelling of the face, lips or tongue -breathing problems -changes in vision -chest pain -fast, irregular heartbeat -feeling faint or lightheaded, falls -fever -high blood pressure -nervousness -tremors -white patches or sores in mouth Side effects that usually do not require medical attention (report to your doctor or health care professional if they continue or are bothersome): -cough -different taste in mouth -headache -sore throat -stuffy nose -stomach upset This list may not describe all possible side effects. Call your doctor for medical advice about side effects. You may report side effects to FDA at 1-800-FDA-1088. Where should I keep my medicine? Keep out of the reach of children. Store in a dry place at room temperature between 20 and 25 degrees C (68 and 77 degrees F). Do not get the inhaler wet. Keep track of the number of doses used. Throw away the inhaler after using the marked number of inhalations or after the expiration date, whichever comes first. Do not burn or puncture canister. NOTE: This sheet is a summary. It may not cover all possible information. If you have questions about this medicine, talk to your doctor, pharmacist, or health care provider.  2018 Elsevier/Gold Standard (2016-11-16 15:25:18)

## 2018-06-13 MED ORDER — BUDESONIDE-FORMOTEROL FUMARATE 80-4.5 MCG/ACT IN AERO
2.0000 | INHALATION_SPRAY | Freq: Two times a day (BID) | RESPIRATORY_TRACT | 3 refills | Status: DC
Start: 2018-06-13 — End: 2018-06-20

## 2018-06-20 ENCOUNTER — Telehealth: Payer: Self-pay

## 2018-06-20 DIAGNOSIS — R0602 Shortness of breath: Secondary | ICD-10-CM

## 2018-06-20 DIAGNOSIS — R05 Cough: Secondary | ICD-10-CM

## 2018-06-20 DIAGNOSIS — R0609 Other forms of dyspnea: Secondary | ICD-10-CM

## 2018-06-20 DIAGNOSIS — R059 Cough, unspecified: Secondary | ICD-10-CM

## 2018-06-20 MED ORDER — BUDESONIDE-FORMOTEROL FUMARATE 80-4.5 MCG/ACT IN AERO: 2.0000 | INHALATION_SPRAY | Freq: Two times a day (BID) | RESPIRATORY_TRACT | 3 refills | Status: DC

## 2018-06-20 MED FILL — !SYMBICORT 80-4.5 MCG INH: 80-4.5 | 30 days supply | Qty: 1 | Fill #0

## 2018-06-20 NOTE — Telephone Encounter (Signed)
This has been sent to pharmacy. Thanks!  

## 2018-06-26 ENCOUNTER — Ambulatory Visit (INDEPENDENT_AMBULATORY_CARE_PROVIDER_SITE_OTHER): Payer: Self-pay | Admitting: Family Medicine

## 2018-06-26 ENCOUNTER — Encounter: Payer: Self-pay | Admitting: Family Medicine

## 2018-06-26 VITALS — BP 142/76 | HR 72 | Temp 98.0°F | Ht 63.0 in | Wt 198.0 lb

## 2018-06-26 DIAGNOSIS — R05 Cough: Secondary | ICD-10-CM

## 2018-06-26 DIAGNOSIS — E781 Pure hyperglyceridemia: Secondary | ICD-10-CM

## 2018-06-26 DIAGNOSIS — R0609 Other forms of dyspnea: Secondary | ICD-10-CM

## 2018-06-26 DIAGNOSIS — R059 Cough, unspecified: Secondary | ICD-10-CM

## 2018-06-26 DIAGNOSIS — R0600 Dyspnea, unspecified: Secondary | ICD-10-CM

## 2018-06-26 DIAGNOSIS — R42 Dizziness and giddiness: Secondary | ICD-10-CM

## 2018-06-26 DIAGNOSIS — R0602 Shortness of breath: Secondary | ICD-10-CM

## 2018-06-26 DIAGNOSIS — E559 Vitamin D deficiency, unspecified: Secondary | ICD-10-CM

## 2018-06-26 DIAGNOSIS — Z09 Encounter for follow-up examination after completed treatment for conditions other than malignant neoplasm: Secondary | ICD-10-CM

## 2018-06-26 LAB — POCT URINALYSIS DIP (MANUAL ENTRY)
Bilirubin, UA: NEGATIVE
Blood, UA: NEGATIVE
Glucose, UA: NEGATIVE mg/dL
Ketones, POC UA: NEGATIVE mg/dL
Leukocytes, UA: NEGATIVE
Nitrite, UA: NEGATIVE
Protein Ur, POC: NEGATIVE mg/dL
Spec Grav, UA: >=1.03 — AB (ref 1.010–1.025)
Urobilinogen, UA: 0.2 E.U./dL
pH, UA: 5 (ref 5.0–8.0)

## 2018-06-26 MED ORDER — GEMFIBROZIL 600 MG PO TABS: 600.0000 mg | ORAL_TABLET | Freq: Two times a day (BID) | ORAL | 3 refills | Status: DC

## 2018-06-26 MED ORDER — VITAMIN D (ERGOCALCIFEROL) 1.25 MG (50000 UNIT) PO CAPS: 50000.0000 [IU] | ORAL_CAPSULE | ORAL | 1 refills | Status: DC

## 2018-06-26 NOTE — Progress Notes (Signed)
Follow Up  Subjective:    Patient ID: Kathy Hamilton, female    DOB: 1966/05/14, 52 y.o.   MRN: [id number]   Chief Complaint  Patient presents with  . Follow-up    SOB    HPI  Kathy Hamilton has a past medical history of upper respiratory problems.   Current Status: Since her last office visit, she is doing well with no complaints. She has not been able to afford Symbicort at pharmacy. She has occasional cough , lightheadedness, and shortness of breath on exertion.   She denies fevers, chills, fatigue, recent infections, weight loss, and night sweats. She has not had any headaches, visual changes, dizziness, and falls. No chest pain, heart palpitations, cough and shortness of breath reported. No reports of GI problems such as nausea, vomiting, diarrhea, and constipation. She has no reports of blood in stools, dysuria and hematuria. No depression or anxiety, and denies suicidal ideations, homicidal ideations, or auditory hallucinations. She denies pain today.   History reviewed. No pertinent past medical history.  Family History  Problem Relation Age of Onset  . Cancer Mother   . Stroke Mother   . Heart attack Mother   . Bipolar disorder Sister   . Stroke Brother   . Asthma Daughter   . Bipolar disorder Daughter   . ADD / ADHD Son   . Asperger's syndrome Son     Social History   Socioeconomic History  . Marital status: Widowed    Spouse name: Not on file  . Number of children: Not on file  . Years of education: Not on file  . Highest education level: Not on file  Occupational History  . Not on file  Social Needs  . Financial resource strain: Not on file  . Food insecurity:    Worry: Not on file    Inability: Not on file  . Transportation needs:    Medical: Not on file    Non-medical: Not on file  Tobacco Use  . Smoking status: Former Games developer  . Smokeless tobacco: Never Used  Substance and Sexual Activity  . Alcohol use: No  . Drug use: No  . Sexual activity: Yes  Lifestyle   . Physical activity:    Days per week: Not on file    Minutes per session: Not on file  . Stress: Not on file  Relationships  . Social connections:    Talks on phone: Not on file    Gets together: Not on file    Attends religious service: Not on file    Active member of club or organization: Not on file    Attends meetings of clubs or organizations: Not on file    Relationship status: Not on file  . Intimate partner violence:    Fear of current or ex partner: Not on file    Emotionally abused: Not on file    Physically abused: Not on file    Forced sexual activity: Not on file  Other Topics Concern  . Not on file  Social History Narrative  . Not on file    Past Surgical History:  Procedure Laterality Date  . CHOLECYSTECTOMY    . ORTHOPEDIC SURGERY Left 2013   rotator cuff  . TUBAL LIGATION     Immunization History  Administered Date(s) Administered  . Tdap 03/04/2015    Current Meds  Medication Sig  . albuterol (PROVENTIL HFA;VENTOLIN HFA) 108 (90 Base) MCG/ACT inhaler Inhale 2 puffs into the lungs every 6 (six)  hours as needed for wheezing or shortness of breath.  Marland Kitchen albuterol (PROVENTIL) (2.5 MG/3ML) 0.083% nebulizer solution Take 3 mLs (2.5 mg total) by nebulization every 6 (six) hours as needed for wheezing or shortness of breath.  . cetirizine (ZYRTEC) 10 MG tablet Take 1 tablet (10 mg total) by mouth daily.  Marland Kitchen gemfibrozil (LOPID) 600 MG tablet Take 1 tablet (600 mg total) by mouth 2 (two) times daily before a meal.  . guaiFENesin (MUCINEX) 600 MG 12 hr tablet Take 1 tablet (600 mg total) by mouth 2 (two) times daily.  . Vitamin D, Ergocalciferol, (DRISDOL) 50000 units CAPS capsule Take 1 capsule (50,000 Units total) by mouth every 7 (seven) days.  . [DISCONTINUED] gemfibrozil (LOPID) 600 MG tablet Take 1 tablet (600 mg total) by mouth 2 (two) times daily before a meal.  . [DISCONTINUED] Vitamin D, Ergocalciferol, (DRISDOL) 50000 units CAPS capsule Take 1 capsule  (50,000 Units total) by mouth every 7 (seven) days.    Allergies  Allergen Reactions  . Oxycodone     Unknown reaction     BP (!) 142/76 (BP Location: Left Arm, Patient Position: Sitting, Cuff Size: Large)   Pulse 72   Temp 98 F (36.7 C) (Oral)   Ht 5\' 3"  (1.6 m)   Wt 198 lb (89.8 kg)   LMP 06/09/2018   SpO2 98%   BMI 35.07 kg/m   Review of Systems  Constitutional: Negative.   HENT: Negative.   Eyes: Negative.   Respiratory: Positive for cough (Occasional ) and shortness of breath (on exertion).   Cardiovascular: Negative.   Gastrointestinal: Negative.   Endocrine: Negative.   Genitourinary: Negative.   Musculoskeletal: Negative.   Skin: Negative.   Allergic/Immunologic: Negative.   Neurological: Positive for light-headedness (Occasional).  Hematological: Negative.   Psychiatric/Behavioral: Negative.    Objective:   Physical Exam  Constitutional: She is oriented to person, place, and time. She appears well-developed and well-nourished.  HENT:  Head: Normocephalic and atraumatic.  Right Ear: External ear normal.  Left Ear: External ear normal.  Nose: Nose normal.  Mouth/Throat: Oropharynx is clear and moist.  Eyes: Pupils are equal, round, and reactive to light. Conjunctivae and EOM are normal.  Neck: Normal range of motion. Neck supple.  Cardiovascular: Normal rate, regular rhythm, normal heart sounds and intact distal pulses.  Pulmonary/Chest: Effort normal and breath sounds normal.  Abdominal: Soft. Bowel sounds are normal.  Musculoskeletal: Normal range of motion.  Neurological: She is alert and oriented to person, place, and time.  Skin: Skin is warm and dry. Capillary refill takes less than 2 seconds.  Psychiatric: She has a normal mood and affect. Her behavior is normal. Judgment and thought content normal.  Nursing note and vitals reviewed.  Assessment & Plan:   1. Hypertriglyceridemia Lipid panel stable on 05/06/2018 at 189. She will continue  Gemfibrozil as prescribe and decrease high fats in diet and increase activity.  - gemfibrozil (LOPID) 600 MG tablet; Take 1 tablet (600 mg total) by mouth 2 (two) times daily before a meal.  Dispense: 60 tablet; Refill: 3  2. Lightheaded Occasional. She continues to decrease ambulating when she is lightheaded to prevent falls. She is to report to office if episodes become more frequent or worsen, so that we can evaluate further.   3. Dyspnea on exertion Stable.   4. Shortness of breath Stable. She is in no respiratory distress today. She will pick up Symbicort at [facility]. Continue Albuterol inhaler, nebs,  and Zyrtec as prescribed.   5. Cough Stable. Not worsening.   6. Vitamin D deficiency Vitamin D level decreased at 12.7 on 05/06/2018. Continue Vit D supplement as prescribed. Continue to monitor. - Vitamin D, Ergocalciferol, (DRISDOL) 50000 units CAPS capsule; Take 1 capsule (50,000 Units total) by mouth every 7 (seven) days.  Dispense: 5 capsule; Refill: 1  7. Follow up She will follow up in 2 months.   Meds ordered this encounter  Medications  . Vitamin D, Ergocalciferol, (DRISDOL) 50000 units CAPS capsule    Sig: Take 1 capsule (50,000 Units total) by mouth every 7 (seven) days.    Dispense:  5 capsule    Refill:  1  . gemfibrozil (LOPID) 600 MG tablet    Sig: Take 1 tablet (600 mg total) by mouth 2 (two) times daily before a meal.    Dispense:  60 tablet    Refill:  3    Raliegh IpNatalie Aniylah Avans,  MSN, FNP-C Patient [facility] [address]  [place], [place] [address] [phone]

## 2018-08-06 MED FILL — $Symbicort 80-4.5mcg inhale: 80-4.5 | 90 days supply | Qty: 3 | Fill #1

## 2018-08-07 MED FILL — !VENTOLIN HFA INHALER: 108 (90 BAS | 25 days supply | Qty: 18 | Fill #0

## 2018-08-07 MED FILL — ALBUTEROL 0.083% INHAL SOLN: (2.5 MG/3ML | 22 days supply | Qty: 90 | Fill #0

## 2018-08-07 MED FILL — GEMFIBROZIL 600 MG TAB: 600 | 30 days supply | Qty: 60 | Fill #0

## 2018-08-07 MED FILL — VIT D2 1.25 MG (50,000 UNIT: 1.25 MG | 28 days supply | Qty: 4 | Fill #0

## 2018-08-26 ENCOUNTER — Ambulatory Visit: Payer: Self-pay | Admitting: Family Medicine

## 2018-09-06 ENCOUNTER — Other Ambulatory Visit: Payer: Self-pay | Admitting: Family Medicine

## 2018-09-06 ENCOUNTER — Other Ambulatory Visit: Payer: Self-pay

## 2018-09-06 DIAGNOSIS — E559 Vitamin D deficiency, unspecified: Secondary | ICD-10-CM

## 2018-09-06 MED ORDER — VITAMIN D (ERGOCALCIFEROL) 1.25 MG (50000 UNIT) PO CAPS: 50000.0000 [IU] | ORAL_CAPSULE | ORAL | 3 refills | Status: DC

## 2018-09-06 MED FILL — VIT D2 1.25 MG (50,000 UNIT: 1.25 MG | 35 days supply | Qty: 5 | Fill #0

## 2018-09-06 NOTE — Telephone Encounter (Signed)
Medication sent.

## 2018-09-09 MED FILL — GEMFIBROZIL 600 MG TAB: 600 | 30 days supply | Qty: 60 | Fill #1

## 2018-10-28 ENCOUNTER — Telehealth: Payer: Self-pay

## 2018-10-28 NOTE — Telephone Encounter (Signed)
Patient states that she is having right arm and shoulder pain for 1 week. Patient was advise to go to urgent care and to ed if it gets worse.

## 2018-11-06 ENCOUNTER — Encounter (HOSPITAL_COMMUNITY): Payer: Self-pay

## 2018-11-06 ENCOUNTER — Ambulatory Visit (HOSPITAL_COMMUNITY)
Admission: EM | Admit: 2018-11-06 | Discharge: 2018-11-06 | Disposition: A | Discharge status: Home / Self Care | Payer: Self-pay | Attending: Family Medicine | Admitting: Family Medicine

## 2018-11-06 DIAGNOSIS — M25511 Pain in right shoulder: Secondary | ICD-10-CM

## 2018-11-06 DIAGNOSIS — M62838 Other muscle spasm: Secondary | ICD-10-CM

## 2018-11-06 MED ORDER — CYCLOBENZAPRINE HCL 10 MG PO TABS: ORAL_TABLET | ORAL | 0 refills | Status: DC

## 2018-11-06 MED ORDER — PREDNISONE 10 MG (21) PO TBPK: ORAL_TABLET | Freq: Every day | ORAL | 0 refills | Status: DC

## 2018-11-06 MED FILL — VIT D2 1.25 MG (50,000 UNIT: 1.25 MG | 35 days supply | Qty: 5 | Fill #1

## 2018-11-06 MED FILL — predniSONE 10 MG TABS: 10 | 7 days supply | Qty: 21 | Fill #0

## 2018-11-06 MED FILL — GEMFIBROZIL 600 MG TAB: 600 | 30 days supply | Qty: 60 | Fill #2

## 2018-11-06 MED FILL — CYCLOBENZAPRINE 10 MG TAB: 10 | 10 days supply | Qty: 10 | Fill #0

## 2018-11-06 NOTE — ED Triage Notes (Addendum)
Pt presents right shoulder pain from sleeping on her shoulder a week ago.  Pt states that her shoulder has a pinch in the blade area.

## 2018-11-06 NOTE — ED Provider Notes (Signed)
[facility]   [id number] 11/06/18 Arrival Time: 1118  ASSESSMENT & PLAN:  1. Trapezius muscle spasm   Right-sided. No trauma/injury. No radicular symptoms. No indications for imaging at this time. Discussed.  Meds ordered this encounter  Medications  . cyclobenzaprine (FLEXERIL) 10 MG tablet    Sig: Take 1 tablet by mouth before bed as needed for muscle spasm. Warning: May cause drowsiness.    Dispense:  10 tablet    Refill:  0  . predniSONE (STERAPRED UNI-PAK 21 TAB) 10 MG (21) TBPK tablet    Sig: Take by mouth daily. Take as directed.    Dispense:  21 tablet    Refill:  0   Follow-up Information    Kallie LocksStroud, Natalie M, FNP.   Specialty:  Family Medicine Why:  As needed. Contact information: [address]  [place] [address] [phone]        [facility].   Specialty:  Urgent Care Why:  If symptoms worsen. Contact information: [address] [place] [id number] [phone]         Encourage neck and RUE ROM as she tolerates. Medication sedation precautions given. Is able to avoid strenuous activities at work.  Reviewed expectations re: course of current medical issues. Questions answered. Outlined signs and symptoms indicating need for more acute intervention. Patient verbalized understanding. After Visit Summary given.  SUBJECTIVE: History from: patient. Kathy McmurrayLisa P Hamilton is a 52 y.o. female who reports fairly persistent mild to moderate pain of her right neck extending into right upper back; gradual onset over the past week; described as tightness/aching. Does reports feeling associated discomfort around her right shoulder as well. No ROM loss. 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 Injury/trama: no. Symptoms have progressed to a point and plateaued since beginning. Aggravating factors: movement. Alleviating factors: none identified. Associated  symptoms: none reported. Extremity sensation changes or weakness: none. Self treatment: tried OTCs without relief of pain. History of similar: no. No headaches or visual changes.  Past Surgical History:  Procedure Laterality Date  . CHOLECYSTECTOMY    . ORTHOPEDIC SURGERY Left 2013   rotator cuff  . TUBAL LIGATION       ROS: As per HPI.   OBJECTIVE:  Vitals:   11/06/18 1238  BP: (!) 159/79  Pulse: 68  Resp: 16  Temp: 97.7 F (36.5 C)  TempSrc: Oral  SpO2: 99%    General appearance: alert; no distress Neck: supple with FROM; does have tenderness over posterior right cervical musculature extending over right trapezius distribution; no midline tenderness Extremities: . RUE: warm and well perfused; no specific tenderness over R shoulder; without gross deformities; with no swelling; with no bruising; ROM: normal CV: brisk extremity capillary refill of RUE and LUE; 2+ radial pulse of RUE and LUE. Skin: warm and dry; no visible rashes Neurologic: gait normal; normal reflexes of RUE and LUE; normal sensation of RUE and LUE; normal strength of RUE and LUE Psychological: alert and cooperative; normal mood and affect  Allergies  Allergen Reactions  . Oxycodone     Unknown reaction      Social History   Socioeconomic History  . Marital status: Widowed    Spouse name: Not on file  . Number of children: Not on file  . Years of education: Not on file  . Highest education level: Not on file  Occupational History  . Not on file  Social Needs  . Financial resource strain: Not on file  . Food insecurity:    Worry: Not on file    Inability: Not on file  . Transportation needs:    Medical: Not on file    Non-medical: Not on file  Tobacco Use  . Smoking status: Former Games developer  . Smokeless tobacco: Never Used  Substance and Sexual Activity  . Alcohol use: No  . Drug use: No  . Sexual activity: Yes  Lifestyle  . Physical activity:    Days per week: Not on file     Minutes per session: Not on file  . Stress: Not on file  Relationships  . Social connections:    Talks on phone: Not on file    Gets together: Not on file    Attends religious service: Not on file    Active member of club or organization: Not on file    Attends meetings of clubs or organizations: Not on file    Relationship status: Not on file  Other Topics Concern  . Not on file  Social History Narrative  . Not on file   Family History  Problem Relation Age of Onset  . Cancer Mother   . Stroke Mother   . Heart attack Mother   . Bipolar disorder Sister   . Stroke Brother   . Asthma Daughter   . Bipolar disorder Daughter   . ADD / ADHD Son   . Asperger's syndrome Son    Past Surgical History:  Procedure Laterality Date  . CHOLECYSTECTOMY    . ORTHOPEDIC SURGERY Left 2013   rotator cuff  . TUBAL LIGATION        Mardella Layman, MD 11/06/18 [phone]

## 2019-04-11 ENCOUNTER — Other Ambulatory Visit: Payer: Self-pay

## 2019-04-11 ENCOUNTER — Ambulatory Visit (INDEPENDENT_AMBULATORY_CARE_PROVIDER_SITE_OTHER): Payer: Self-pay | Admitting: Family Medicine

## 2019-04-11 ENCOUNTER — Encounter: Payer: Self-pay | Admitting: Family Medicine

## 2019-04-11 VITALS — BP 140/84 | HR 94 | Temp 98.2°F | Ht 63.0 in | Wt 187.0 lb

## 2019-04-11 DIAGNOSIS — Z09 Encounter for follow-up examination after completed treatment for conditions other than malignant neoplasm: Secondary | ICD-10-CM

## 2019-04-11 DIAGNOSIS — Z3202 Encounter for pregnancy test, result negative: Secondary | ICD-10-CM

## 2019-04-11 DIAGNOSIS — R0602 Shortness of breath: Secondary | ICD-10-CM

## 2019-04-11 DIAGNOSIS — R0600 Dyspnea, unspecified: Secondary | ICD-10-CM | POA: Insufficient documentation

## 2019-04-11 DIAGNOSIS — R059 Cough, unspecified: Secondary | ICD-10-CM

## 2019-04-11 DIAGNOSIS — Z0000 Encounter for general adult medical examination without abnormal findings: Secondary | ICD-10-CM

## 2019-04-11 DIAGNOSIS — R0609 Other forms of dyspnea: Secondary | ICD-10-CM | POA: Insufficient documentation

## 2019-04-11 DIAGNOSIS — N959 Unspecified menopausal and perimenopausal disorder: Secondary | ICD-10-CM | POA: Insufficient documentation

## 2019-04-11 DIAGNOSIS — Z131 Encounter for screening for diabetes mellitus: Secondary | ICD-10-CM

## 2019-04-11 DIAGNOSIS — E559 Vitamin D deficiency, unspecified: Secondary | ICD-10-CM | POA: Insufficient documentation

## 2019-04-11 DIAGNOSIS — N926 Irregular menstruation, unspecified: Secondary | ICD-10-CM | POA: Insufficient documentation

## 2019-04-11 DIAGNOSIS — J302 Other seasonal allergic rhinitis: Secondary | ICD-10-CM | POA: Insufficient documentation

## 2019-04-11 DIAGNOSIS — R05 Cough: Secondary | ICD-10-CM

## 2019-04-11 LAB — POCT URINALYSIS DIP (MANUAL ENTRY)
Bilirubin, UA: NEGATIVE
Blood, UA: NEGATIVE
Glucose, UA: 100 mg/dL — AB
Ketones, POC UA: NEGATIVE mg/dL
Leukocytes, UA: NEGATIVE
Nitrite, UA: NEGATIVE
Protein Ur, POC: NEGATIVE mg/dL
Spec Grav, UA: >=1.03 — AB (ref 1.010–1.025)
Urobilinogen, UA: 0.2 E.U./dL
pH, UA: 5 (ref 5.0–8.0)

## 2019-04-11 LAB — POCT GLYCOSYLATED HEMOGLOBIN (HGB A1C): Hemoglobin A1C: 5.6 % (ref 4.0–5.6)

## 2019-04-11 LAB — POCT URINE PREGNANCY: Preg Test, Ur: NEGATIVE

## 2019-04-11 MED ORDER — ALBUTEROL SULFATE (2.5 MG/3ML) 0.083% IN NEBU: 2.5000 mg | INHALATION_SOLUTION | Freq: Four times a day (QID) | RESPIRATORY_TRACT | 6 refills | Status: DC | PRN

## 2019-04-11 MED ORDER — BUDESONIDE-FORMOTEROL FUMARATE 80-4.5 MCG/ACT IN AERO: 2.0000 | INHALATION_SPRAY | Freq: Two times a day (BID) | RESPIRATORY_TRACT | 6 refills | Status: DC

## 2019-04-11 MED ORDER — CETIRIZINE HCL 10 MG PO TABS: 10.0000 mg | ORAL_TABLET | Freq: Every day | ORAL | 11 refills | Status: DC

## 2019-04-11 MED ORDER — ALBUTEROL SULFATE HFA 108 (90 BASE) MCG/ACT IN AERS: 2.0000 | INHALATION_SPRAY | Freq: Four times a day (QID) | RESPIRATORY_TRACT | 6 refills | Status: DC | PRN

## 2019-04-11 MED ORDER — VITAMIN D (ERGOCALCIFEROL) 1.25 MG (50000 UNIT) PO CAPS: 50000.0000 [IU] | ORAL_CAPSULE | ORAL | 3 refills | Status: DC

## 2019-04-11 MED FILL — ALBUTEROL SULFATE HFA 108 (: 108 (90 BAS | 25 days supply | Qty: 18 | Fill #0

## 2019-04-11 MED FILL — ALBUTEROL SUL 2.5 MG/3 ML S: (2.5 MG/3ML | 6 days supply | Qty: 75 | Fill #0

## 2019-04-11 MED FILL — VIT D2 1.25 MG (50,000 UNIT: 1.25 MG | 84 days supply | Qty: 12 | Fill #0

## 2019-04-11 MED FILL — SYMBICORT 80-4.5 MCG INH: 80-4.5 | 30 days supply | Qty: 10 | Fill #0

## 2019-04-11 NOTE — Progress Notes (Signed)
[facility]   Established Patient Office Visit  Subjective:  Patient ID: Kathy Hamilton, female    DOB: 1966-11-05  Age: 53 y.o. MRN: [id number]  CC:  Chief Complaint  Patient presents with  . Possible pregnancy  . Follow-up    sob    HPI Kathy Hamilton is a 53 year old female presents for Follow Up today.   Past Medical History:  Diagnosis Date  . Bronchitis   . Community acquired pneumonia   . H/O tubal ligation 09/2019  . Hypertriglyceridemia   . Premenopausal patient   . Seasonal allergies   . Shortness of breath     Current Status: Since her last office visit, she states that she may be pregnant and would like to be tested. Her last menstrual period was 02/2019. She his history of Tubal Ligation in 09/2001, and she feels that she may be pregnant in her fallopian tubes. She does have occasional shortness of breath. She has had a 11 lb weight loss since her last office visit. She continues to drink plenty of water daily.  She denies fevers, chills, fatigue, recent infections, weight loss, and night sweats. She has not had any headaches, visual changes, dizziness, and falls. No chest pain, and heart palpitations reported. No reports of GI problems such as nausea, vomiting, diarrhea, and constipation. She has no reports of blood in stools, dysuria and hematuria. No depression or anxiety reported. She denies pain today.    Past Surgical History:  Procedure Laterality Date  . CHOLECYSTECTOMY    . ORTHOPEDIC SURGERY Left 2013   rotator cuff  . TUBAL LIGATION      Family History  Problem Relation Age of Onset  . Cancer Mother   . Stroke Mother   . Heart attack Mother   . Bipolar disorder Sister   . Stroke Brother   . Asthma Daughter   . Bipolar disorder Daughter   . ADD / ADHD Son   . Asperger's syndrome Son     Social History   Socioeconomic History  . Marital status: Widowed    Spouse name: Not on file  . Number of  children: Not on file  . Years of education: Not on file  . Highest education level: Not on file  Occupational History  . Not on file  Social Needs  . Financial resource strain: Not on file  . Food insecurity:    Worry: Not on file    Inability: Not on file  . Transportation needs:    Medical: Not on file    Non-medical: Not on file  Tobacco Use  . Smoking status: Former Games developer  . Smokeless tobacco: Never Used  Substance and Sexual Activity  . Alcohol use: No  . Drug use: No  . Sexual activity: Yes  Lifestyle  . Physical activity:    Days per week: Not on file    Minutes per session: Not on file  . Stress: Not on file  Relationships  . Social connections:    Talks on phone: Not on file    Gets together: Not on file    Attends religious service: Not on file    Active member of club or organization: Not on file    Attends meetings of clubs or organizations: Not on file    Relationship status: Not on file  . Intimate partner violence:    Fear of current or ex partner: Not on file  Emotionally abused: Not on file    Physically abused: Not on file    Forced sexual activity: Not on file  Other Topics Concern  . Not on file  Social History Narrative  . Not on file    Outpatient Medications Prior to Visit  Medication Sig Dispense Refill  . gemfibrozil (LOPID) 600 MG tablet Take 1 tablet (600 mg total) by mouth 2 (two) times daily before a meal. 60 tablet 3  . albuterol (PROVENTIL HFA;VENTOLIN HFA) 108 (90 Base) MCG/ACT inhaler Inhale 2 puffs into the lungs every 6 (six) hours as needed for wheezing or shortness of breath. 1 Inhaler 3  . albuterol (PROVENTIL) (2.5 MG/3ML) 0.083% nebulizer solution Take 3 mLs (2.5 mg total) by nebulization every 6 (six) hours as needed for wheezing or shortness of breath. 75 mL 3  . cyclobenzaprine (FLEXERIL) 10 MG tablet Take 1 tablet by mouth before bed as needed for muscle spasm. Warning: May cause drowsiness. (Patient not taking: Reported  on 04/11/2019) 10 tablet 0  . cetirizine (ZYRTEC) 10 MG tablet Take 1 tablet (10 mg total) by mouth daily. (Patient not taking: Reported on 04/11/2019) 30 tablet 11  . guaiFENesin (MUCINEX) 600 MG 12 hr tablet Take 1 tablet (600 mg total) by mouth 2 (two) times daily. 60 tablet 3  . predniSONE (STERAPRED UNI-PAK 21 TAB) 10 MG (21) TBPK tablet Take by mouth daily. Take as directed. 21 tablet 0  . Vitamin D, Ergocalciferol, (DRISDOL) 50000 units CAPS capsule Take 1 capsule (50,000 Units total) by mouth every 7 (seven) days. (Patient not taking: Reported on 04/11/2019) 5 capsule 3   No facility-administered medications prior to visit.     Allergies  Allergen Reactions  . Oxycodone     Unknown reaction     ROS Review of Systems  Constitutional: Negative.   HENT: Negative.   Eyes: Negative.   Respiratory: Positive for shortness of breath (occasional ).   Cardiovascular: Negative.   Gastrointestinal: Negative.   Endocrine: Negative.   Genitourinary: Negative.   Musculoskeletal: Negative.   Skin: Negative.   Allergic/Immunologic:       Seasonal allergies  Neurological: Negative.   Hematological: Negative.   Psychiatric/Behavioral: Negative.       Objective:    Physical Exam  Constitutional: She is oriented to person, place, and time. She appears well-developed and well-nourished.  HENT:  Head: Normocephalic and atraumatic.  Eyes: Conjunctivae are normal.  Neck: Normal range of motion. Neck supple.  Cardiovascular: Normal rate, regular rhythm, normal heart sounds and intact distal pulses.  Pulmonary/Chest: Effort normal and breath sounds normal.  Abdominal: Soft. Bowel sounds are normal. She exhibits distension (obese).  Musculoskeletal: Normal range of motion.  Neurological: She is alert and oriented to person, place, and time. She has normal reflexes.  Skin: Skin is warm and dry.  Psychiatric: She has a normal mood and affect. Her behavior is normal. Judgment and thought  content normal.  Nursing note and vitals reviewed.   BP 140/84   Pulse 94   Temp 98.2 F (36.8 C) (Oral)   Ht  (1.6 m)   Wt 187 lb (84.8 kg)   LMP 03/18/2019   SpO2 97%   BMI 33.13 kg/m  Wt Readings from Last 3 Encounters:  04/11/19 187 lb (84.8 kg)  06/26/18 198 lb (89.8 kg)  06/05/18 195 lb (88.5 kg)     Health Maintenance Due  Topic Date Due  . HIV Screening  06/29/1981  . COLONOSCOPY  06/29/2016  .  PAP SMEAR-Modifier  12/25/2016  . MAMMOGRAM  05/01/2018    There are no preventive care reminders to display for this patient.  Lab Results  Component Value Date   TSH 2.030 05/06/2018   Lab Results  Component Value Date   WBC 7.9 05/06/2018   HGB 12.9 05/06/2018   HCT 41.4 05/06/2018   MCV 91 05/06/2018   PLT 387 05/06/2018   Lab Results  Component Value Date   NA 140 05/06/2018   K 4.5 05/06/2018   CO2 20 05/06/2018   GLUCOSE 96 05/06/2018   BUN 11 05/06/2018   CREATININE 0.63 05/06/2018   BILITOT 0.3 05/06/2018   ALKPHOS 68 05/06/2018   AST 13 05/06/2018   ALT 11 05/06/2018   PROT 6.7 05/06/2018   ALBUMIN 4.3 05/06/2018   CALCIUM 8.9 05/06/2018   ANIONGAP 9 04/06/2018   Lab Results  Component Value Date   CHOL 178 05/06/2018   Lab Results  Component Value Date   HDL 44 05/06/2018   Lab Results  Component Value Date   LDLCALC 96 05/06/2018   Lab Results  Component Value Date   TRIG 189 (H) 05/06/2018   Lab Results  Component Value Date   CHOLHDL 4.0 05/06/2018   Lab Results  Component Value Date   HGBA1C 5.6 04/11/2019      Assessment & Plan:   1. Irregular periods Urine pregnancy test is negative. Results are pending for blood pregnancy test.  - POCT urine pregnancy - hCG, serum, qualitative  2. Premenopausal patient   3. Dyspnea on exertion - albuterol (VENTOLIN HFA) 108 (90 Base) MCG/ACT inhaler; Inhale 2 puffs into the lungs every 6 (six) hours as needed for wheezing or shortness of breath.  Dispense: 1 Inhaler;  Refill: 6 - albuterol (PROVENTIL) (2.5 MG/3ML) 0.083% nebulizer solution; Take 3 mLs (2.5 mg total) by nebulization every 6 (six) hours as needed for wheezing or shortness of breath.  Dispense: 75 mL; Refill: 6 - budesonide-formoterol (SYMBICORT) 80-4.5 MCG/ACT inhaler; Inhale 2 puffs into the lungs 2 (two) times daily.  Dispense: 1 Inhaler; Refill: 6  4. Shortness of breath - albuterol (VENTOLIN HFA) 108 (90 Base) MCG/ACT inhaler; Inhale 2 puffs into the lungs every 6 (six) hours as needed for wheezing or shortness of breath.  Dispense: 1 Inhaler; Refill: 6 - albuterol (PROVENTIL) (2.5 MG/3ML) 0.083% nebulizer solution; Take 3 mLs (2.5 mg total) by nebulization every 6 (six) hours as needed for wheezing or shortness of breath.  Dispense: 75 mL; Refill: 6 - budesonide-formoterol (SYMBICORT) 80-4.5 MCG/ACT inhaler; Inhale 2 puffs into the lungs 2 (two) times daily.  Dispense: 1 Inhaler; Refill: 6  5. Cough - albuterol (VENTOLIN HFA) 108 (90 Base) MCG/ACT inhaler; Inhale 2 puffs into the lungs every 6 (six) hours as needed for wheezing or shortness of breath.  Dispense: 1 Inhaler; Refill: 6 - albuterol (PROVENTIL) (2.5 MG/3ML) 0.083% nebulizer solution; Take 3 mLs (2.5 mg total) by nebulization every 6 (six) hours as needed for wheezing or shortness of breath.  Dispense: 75 mL; Refill: 6 - budesonide-formoterol (SYMBICORT) 80-4.5 MCG/ACT inhaler; Inhale 2 puffs into the lungs 2 (two) times daily.  Dispense: 1 Inhaler; Refill: 6  6. Seasonal allergies We will initiate antihistamine today.  - cetirizine (ZYRTEC) 10 MG tablet; Take 1 tablet (10 mg total) by mouth daily. (Patient not taking: Reported on 04/11/2019)  Dispense: 30 tablet; Refill: 11  7. Vitamin D deficiency - Vitamin D, Ergocalciferol, (DRISDOL) 1.25 MG (50000 UT) CAPS capsule; Take 1  capsule (50,000 Units total) by mouth every 7 (seven) days.  Dispense: 5 capsule; Refill: 3  8. Screening for diabetes mellitus Hgb A1c is within normal  range of 5.6 today. She will continue to decrease foods/beverages high in sugars and carbs and follow Heart Healthy or DASH diet. Increase physical activity to at least 30 minutes cardio exercise daily.  - POCT glycosylated hemoglobin (Hb A1C) - POCT urinalysis dipstick  9. Healthcare maintenance - CBC with Differential - Comprehensive metabolic panel - Lipid Panel - TSH - Vitamin D, 25-hydroxy - Vitamin B12  10. Follow up She will follow up in 6 months.   Meds ordered this encounter  Medications  . cetirizine (ZYRTEC) 10 MG tablet    Sig: Take 1 tablet (10 mg total) by mouth daily.    Dispense:  30 tablet    Refill:  11  . Vitamin D, Ergocalciferol, (DRISDOL) 1.25 MG (50000 UT) CAPS capsule    Sig: Take 1 capsule (50,000 Units total) by mouth every 7 (seven) days.    Dispense:  5 capsule    Refill:  3  . albuterol (VENTOLIN HFA) 108 (90 Base) MCG/ACT inhaler    Sig: Inhale 2 puffs into the lungs every 6 (six) hours as needed for wheezing or shortness of breath.    Dispense:  1 Inhaler    Refill:  6  . albuterol (PROVENTIL) (2.5 MG/3ML) 0.083% nebulizer solution    Sig: Take 3 mLs (2.5 mg total) by nebulization every 6 (six) hours as needed for wheezing or shortness of breath.    Dispense:  75 mL    Refill:  6  . budesonide-formoterol (SYMBICORT) 80-4.5 MCG/ACT inhaler    Sig: Inhale 2 puffs into the lungs 2 (two) times daily.    Dispense:  1 Inhaler    Refill:  6   Orders Placed This Encounter  Procedures  . hCG, serum, qualitative  . CBC with Differential  . Comprehensive metabolic panel  . Lipid Panel  . TSH  . Vitamin D, 25-hydroxy  . Vitamin B12  . POCT glycosylated hemoglobin (Hb A1C)  . POCT urinalysis dipstick  . POCT urine pregnancy    Referral Orders  No referral(s) requested today    Raliegh IpNatalie Angeleena Dueitt,  MSN, FNP-C [facility] [address]  [place], [place] [id number] [phone]   Problem List Items Addressed  This Visit    None    Visit Diagnoses    Irregular periods    -  Primary   Relevant Orders   POCT urine pregnancy (Completed)   hCG, serum, qualitative   Dyspnea on exertion       Relevant Medications   albuterol (VENTOLIN HFA) 108 (90 Base) MCG/ACT inhaler   albuterol (PROVENTIL) (2.5 MG/3ML) 0.083% nebulizer solution   budesonide-formoterol (SYMBICORT) 80-4.5 MCG/ACT inhaler   Shortness of breath       Relevant Medications   albuterol (VENTOLIN HFA) 108 (90 Base) MCG/ACT inhaler   albuterol (PROVENTIL) (2.5 MG/3ML) 0.083% nebulizer solution   budesonide-formoterol (SYMBICORT) 80-4.5 MCG/ACT inhaler   Cough       Relevant Medications   albuterol (VENTOLIN HFA) 108 (90 Base) MCG/ACT inhaler   albuterol (PROVENTIL) (2.5 MG/3ML) 0.083% nebulizer solution   budesonide-formoterol (SYMBICORT) 80-4.5 MCG/ACT inhaler   Seasonal allergies       Relevant Medications   cetirizine (ZYRTEC) 10 MG tablet   Vitamin D deficiency       Relevant Medications   Vitamin D, Ergocalciferol, (  DRISDOL) 1.25 MG (50000 UT) CAPS capsule   Screening for diabetes mellitus       Relevant Orders   POCT glycosylated hemoglobin (Hb A1C) (Completed)   POCT urinalysis dipstick (Completed)   Healthcare maintenance       Relevant Orders   CBC with Differential   Comprehensive metabolic panel   Lipid Panel   TSH   Vitamin D, 25-hydroxy   Vitamin B12   Follow up          Meds ordered this encounter  Medications  . cetirizine (ZYRTEC) 10 MG tablet    Sig: Take 1 tablet (10 mg total) by mouth daily.    Dispense:  30 tablet    Refill:  11  . Vitamin D, Ergocalciferol, (DRISDOL) 1.25 MG (50000 UT) CAPS capsule    Sig: Take 1 capsule (50,000 Units total) by mouth every 7 (seven) days.    Dispense:  5 capsule    Refill:  3  . albuterol (VENTOLIN HFA) 108 (90 Base) MCG/ACT inhaler    Sig: Inhale 2 puffs into the lungs every 6 (six) hours as needed for wheezing or shortness of breath.    Dispense:  1  Inhaler    Refill:  6  . albuterol (PROVENTIL) (2.5 MG/3ML) 0.083% nebulizer solution    Sig: Take 3 mLs (2.5 mg total) by nebulization every 6 (six) hours as needed for wheezing or shortness of breath.    Dispense:  75 mL    Refill:  6  . budesonide-formoterol (SYMBICORT) 80-4.5 MCG/ACT inhaler    Sig: Inhale 2 puffs into the lungs 2 (two) times daily.    Dispense:  1 Inhaler    Refill:  6    Follow-up: Return in about 6 months (around 10/12/2019).    Kallie Locks, FNP

## 2019-04-11 NOTE — Patient Instructions (Signed)
Cetirizine tablets What is this medicine? CETIRIZINE (se TI ra zeen) is an antihistamine. This medicine is used to treat or prevent symptoms of allergies. It is also used to help reduce itchy skin rash and hives. This medicine may be used for other purposes; ask your health care provider or pharmacist if you have questions. COMMON BRAND NAME(S): All Day Allergy, Allergy Relief, Zyrtec, Zyrtec Hives Relief What should I tell my health care provider before I take this medicine? They need to know if you have any of these conditions: -kidney disease -liver disease -an unusual or allergic reaction to cetirizine, hydroxyzine, other medicines, foods, dyes, or preservatives -pregnant or trying to get pregnant -breast-feeding How should I use this medicine? Take this medicine by mouth with a glass of water. Follow the directions on the prescription label. You can take this medicine with food or on an empty stomach. Take your medicine at regular times. Do not take more often than directed. You may need to take this medicine for several days before your symptoms improve. Talk to your pediatrician regarding the use of this medicine in children. Special care may be needed. While this drug may be prescribed for children as young as 6 years of age for selected conditions, precautions do apply. Overdosage: If you think you have taken too much of this medicine contact a poison control center or emergency room at once. NOTE: This medicine is only for you. Do not share this medicine with others. What if I miss a dose? If you miss a dose, take it as soon as you can. If it is almost time for your next dose, take only that dose. Do not take double or extra doses. What may interact with this medicine? -alcohol -certain medicines for anxiety or sleep -narcotic medicines for pain -other medicines for colds or allergies This list may not describe all possible interactions. Give your health care provider a list of all  the medicines, herbs, non-prescription drugs, or dietary supplements you use. Also tell them if you smoke, drink alcohol, or use illegal drugs. Some items may interact with your medicine. What should I watch for while using this medicine? Visit your doctor or health care professional for regular checks on your health. Tell your doctor if your symptoms do not improve. You may get drowsy or dizzy. Do not drive, use machinery, or do anything that needs mental alertness until you know how this medicine affects you. Do not stand or sit up quickly, especially if you are an older patient. This reduces the risk of dizzy or fainting spells. Your mouth may get dry. Chewing sugarless gum or sucking hard candy, and drinking plenty of water may help. Contact your doctor if the problem does not go away or is severe. What side effects may I notice from receiving this medicine? Side effects that you should report to your doctor or health care professional as soon as possible: -allergic reactions like skin rash, itching or hives, swelling of the face, lips, or tongue -changes in vision or hearing -fast or irregular heartbeat -trouble passing urine or change in the amount of urine Side effects that usually do not require medical attention (report to your doctor or health care professional if they continue or are bothersome): -dizziness -dry mouth -irritability -sore throat -stomach pain -tiredness This list may not describe all possible side effects. Call your doctor for medical advice about side effects. You may report side effects to FDA at 1-800-FDA-1088. Where should I keep my medicine? Keep   out of the reach of children. Store at room temperature between 15 and 30 degrees C (59 and 86 degrees F). Throw away any unused medicine after the expiration date. NOTE: This sheet is a summary. It may not cover all possible information. If you have questions about this medicine, talk to your doctor, pharmacist, or  health care provider.  2019 Elsevier/Gold Standard (2014-12-08 13:44:42) Allergies, Adult An allergy means that your body reacts to something that bothers it (allergen). It is not a normal reaction. This can happen from something that you:  Eat.  Breathe in.  Touch. You can have an allergy (be allergic) to:  Outdoor things, like: ? Pollen. ? Grass. ? Weeds.  Indoor things, like: ? Dust. ? Smoke. ? Pet dander.  Foods.  Medicines.  Things that bother your skin, like: ? Detergents. ? Chemicals. ? Latex.  Perfume.  Bugs. An allergy cannot spread from person to person (is not contagious). Follow these instructions at home:         Stay away from things that you know you are allergic to.  If you have allergies to things in the air, wash out your nose each day. Do it with one of these: ? A salt-water (saline) spray. ? A container (neti pot).  Take over-the-counter and prescription medicines only as told by your doctor.  Keep all follow-up visits as told by your doctor. This is important.  If you are at risk for a very bad allergy reaction (anaphylaxis), keep an auto-injector with you all the time. This is called an epinephrine injection. ? This is pre-measured medicine with a needle. You can put it into your skin by yourself. ? Right after you have a very bad allergy reaction, you or a person with you must give the medicine in less than a few minutes. This is an emergency.  If you have ever had a very bad allergy reaction, wear a medical alert bracelet or necklace. Your very bad allergy should be written on it. Contact a health care provider if:  Your symptoms do not get better with treatment. Get help right away if:  You have symptoms of a very bad allergy reaction. These include: ? A swollen mouth, tongue, or throat. ? Pain or tightness in your chest. ? Trouble breathing. ? Being short of breath. ? Dizziness. ? Fainting. ? Very bad pain in your belly  (abdomen). ? Throwing up (vomiting). ? Watery poop (diarrhea). Summary  An allergy means that your body reacts to something that bothers it (allergen). It is not a normal reaction.  Stay away from things that make your body react.  Take over-the-counter and prescription medicines only as told by your doctor.  If you are at risk for a very bad allergy reaction, carry an auto-injector (epinephrine injection) all the time. Also, wear a medical alert bracelet or necklace so people know about your allergy. This information is not intended to replace advice given to you by your health care provider. Make sure you discuss any questions you have with your health care provider. Document Released: 03/10/2013 Document Revised: 02/26/2017 Document Reviewed: 02/26/2017 Elsevier Interactive Patient Education  2019 Elsevier Inc.  

## 2019-04-12 ENCOUNTER — Other Ambulatory Visit: Payer: Self-pay | Admitting: Family Medicine

## 2019-04-12 LAB — VITAMIN B12: Vitamin B-12: 893 pg/mL (ref 232–1245)

## 2019-04-12 LAB — VITAMIN D 25 HYDROXY (VIT D DEFICIENCY, FRACTURES): Vit D, 25-Hydroxy: 18.8 ng/mL — ABNORMAL LOW (ref 30.0–100.0)

## 2019-04-12 LAB — COMPREHENSIVE METABOLIC PANEL
ALT: 13 IU/L (ref 0–32)
AST: 11 IU/L (ref 0–40)
Albumin/Globulin Ratio: 1.7 (ref 1.2–2.2)
Albumin: 4.5 g/dL (ref 3.8–4.9)
Alkaline Phosphatase: 75 IU/L (ref 39–117)
BUN/Creatinine Ratio: 19 (ref 9–23)
BUN: 13 mg/dL (ref 6–24)
Bilirubin Total: 0.3 mg/dL (ref 0.0–1.2)
CO2: 20 mmol/L (ref 20–29)
Calcium: 9.7 mg/dL (ref 8.7–10.2)
Chloride: 103 mmol/L (ref 96–106)
Creatinine, Ser: 0.67 mg/dL (ref 0.57–1.00)
GFR calc Af Amer: 117 mL/min/{1.73_m2} (ref >59)
GFR calc non Af Amer: 101 mL/min/{1.73_m2} (ref >59)
Globulin, Total: 2.7 g/dL (ref 1.5–4.5)
Glucose: 119 mg/dL — ABNORMAL HIGH (ref 65–99)
Potassium: 4.4 mmol/L (ref 3.5–5.2)
Sodium: 136 mmol/L (ref 134–144)
Total Protein: 7.2 g/dL (ref 6.0–8.5)

## 2019-04-12 LAB — CBC WITH DIFFERENTIAL/PLATELET
Basophils Absolute: 0.1 10*3/uL (ref 0.0–0.2)
Basos: 1 %
EOS (ABSOLUTE): 0.2 10*3/uL (ref 0.0–0.4)
Eos: 2 %
Hematocrit: 42.3 % (ref 34.0–46.6)
Hemoglobin: 14.9 g/dL (ref 11.1–15.9)
Immature Grans (Abs): 0 10*3/uL (ref 0.0–0.1)
Immature Granulocytes: 0 %
Lymphocytes Absolute: 2.5 10*3/uL (ref 0.7–3.1)
Lymphs: 27 %
MCH: 31.1 pg (ref 26.6–33.0)
MCHC: 35.2 g/dL (ref 31.5–35.7)
MCV: 88 fL (ref 79–97)
Monocytes Absolute: 0.5 10*3/uL (ref 0.1–0.9)
Monocytes: 6 %
Neutrophils Absolute: 6 10*3/uL (ref 1.4–7.0)
Neutrophils: 64 %
Platelets: 350 10*3/uL (ref 150–450)
RBC: 4.79 x10E6/uL (ref 3.77–5.28)
RDW: 12.5 % (ref 11.7–15.4)
WBC: 9.4 10*3/uL (ref 3.4–10.8)

## 2019-04-12 LAB — HCG, SERUM, QUALITATIVE: hCG,Beta Subunit,Qual,Serum: NEGATIVE m[IU]/mL (ref <6)

## 2019-04-12 LAB — LIPID PANEL
Chol/HDL Ratio: 3.8 ratio (ref 0.0–4.4)
Cholesterol, Total: 179 mg/dL (ref 100–199)
HDL: 47 mg/dL (ref >39)
LDL Calculated: 87 mg/dL (ref 0–99)
Triglycerides: 227 mg/dL — ABNORMAL HIGH (ref 0–149)
VLDL Cholesterol Cal: 45 mg/dL — ABNORMAL HIGH (ref 5–40)

## 2019-04-12 LAB — TSH: TSH: 1.64 u[IU]/mL (ref 0.450–4.500)

## 2019-04-17 ENCOUNTER — Telehealth: Payer: Self-pay

## 2019-04-17 NOTE — Telephone Encounter (Signed)
Patient states that her job is requesting her to wear a mask at all times while at work. Patient states that she has asthma and is unable to breathe with the mask on. Patient states that she will need a note to give to her job so she doesn't have to wear mask the whole time. Patient is aware that you are out of office today. Please advise

## 2019-04-18 ENCOUNTER — Telehealth: Payer: Self-pay

## 2019-04-22 NOTE — Telephone Encounter (Signed)
duplicate

## 2019-04-23 ENCOUNTER — Telehealth: Payer: Self-pay

## 2019-04-23 NOTE — Telephone Encounter (Signed)
Patient needs a note saying that she can return back to work. Patient COV-19 testing was negative

## 2019-04-24 NOTE — Telephone Encounter (Signed)
Left a vm for patient to callback 

## 2019-04-24 NOTE — Telephone Encounter (Signed)
Patient brought labs showing negative testing for COV-19 and they are in your box

## 2019-04-25 ENCOUNTER — Other Ambulatory Visit: Payer: Self-pay | Admitting: Family Medicine

## 2019-04-25 ENCOUNTER — Encounter (HOSPITAL_COMMUNITY): Payer: Self-pay

## 2019-04-25 NOTE — Telephone Encounter (Signed)
Left a vm letting patient know that letter was ready for pick up

## 2019-09-28 DIAGNOSIS — Z9851 Tubal ligation status: Secondary | ICD-10-CM

## 2019-09-28 HISTORY — DX: Tubal ligation status: Z98.51

## 2019-10-13 ENCOUNTER — Ambulatory Visit (INDEPENDENT_AMBULATORY_CARE_PROVIDER_SITE_OTHER): Payer: Self-pay | Admitting: Family Medicine

## 2019-10-13 ENCOUNTER — Encounter: Payer: Self-pay | Admitting: Family Medicine

## 2019-10-13 ENCOUNTER — Other Ambulatory Visit: Payer: Self-pay

## 2019-10-13 VITALS — BP 148/96 | HR 75 | Temp 97.8°F | Ht 63.0 in | Wt 188.0 lb

## 2019-10-13 DIAGNOSIS — R0602 Shortness of breath: Secondary | ICD-10-CM

## 2019-10-13 DIAGNOSIS — R059 Cough, unspecified: Secondary | ICD-10-CM

## 2019-10-13 DIAGNOSIS — Z09 Encounter for follow-up examination after completed treatment for conditions other than malignant neoplasm: Secondary | ICD-10-CM

## 2019-10-13 DIAGNOSIS — L089 Local infection of the skin and subcutaneous tissue, unspecified: Secondary | ICD-10-CM

## 2019-10-13 DIAGNOSIS — Z0000 Encounter for general adult medical examination without abnormal findings: Secondary | ICD-10-CM

## 2019-10-13 DIAGNOSIS — R0600 Dyspnea, unspecified: Secondary | ICD-10-CM

## 2019-10-13 DIAGNOSIS — R05 Cough: Secondary | ICD-10-CM

## 2019-10-13 DIAGNOSIS — R21 Rash and other nonspecific skin eruption: Secondary | ICD-10-CM

## 2019-10-13 DIAGNOSIS — R0609 Other forms of dyspnea: Secondary | ICD-10-CM

## 2019-10-13 DIAGNOSIS — E781 Pure hyperglyceridemia: Secondary | ICD-10-CM

## 2019-10-13 DIAGNOSIS — E559 Vitamin D deficiency, unspecified: Secondary | ICD-10-CM

## 2019-10-13 LAB — POCT URINALYSIS DIPSTICK
Bilirubin, UA: NEGATIVE
Blood, UA: NEGATIVE
Glucose, UA: NEGATIVE
Ketones, UA: NEGATIVE
Leukocytes, UA: NEGATIVE
Nitrite, UA: NEGATIVE
Protein, UA: NEGATIVE
Spec Grav, UA: 1.02 (ref 1.010–1.025)
Urobilinogen, UA: 0.2 E.U./dL
pH, UA: 5.5 (ref 5.0–8.0)

## 2019-10-13 MED ORDER — DOXYCYCLINE HYCLATE 100 MG PO TABS: 100.0000 mg | ORAL_TABLET | Freq: Two times a day (BID) | ORAL | 0 refills | Status: AC

## 2019-10-13 MED ORDER — BUDESONIDE-FORMOTEROL FUMARATE 80-4.5 MCG/ACT IN AERO: 2.0000 | INHALATION_SPRAY | Freq: Two times a day (BID) | RESPIRATORY_TRACT | 6 refills | Status: DC

## 2019-10-13 MED ORDER — VITAMIN D (ERGOCALCIFEROL) 1.25 MG (50000 UNIT) PO CAPS: 50000.0000 [IU] | ORAL_CAPSULE | ORAL | 6 refills | Status: AC

## 2019-10-13 MED ORDER — ALBUTEROL SULFATE HFA 108 (90 BASE) MCG/ACT IN AERS: 2.0000 | INHALATION_SPRAY | Freq: Four times a day (QID) | RESPIRATORY_TRACT | 12 refills | Status: DC | PRN

## 2019-10-13 MED ORDER — GEMFIBROZIL 600 MG PO TABS: 600.0000 mg | ORAL_TABLET | Freq: Two times a day (BID) | ORAL | 6 refills | Status: DC

## 2019-10-13 MED FILL — ALBUTEROL SULFATE HFA 108 (: 108 (90 BAS | 25 days supply | Qty: 18 | Fill #0

## 2019-10-13 MED FILL — DOXYCYCLINE HYCLATE 100 MG: 100 | 10 days supply | Qty: 20 | Fill #0

## 2019-10-13 MED FILL — !SYMBICORT 80-4.5 MCG INH: 80-4.5 | 30 days supply | Qty: 1 | Fill #0

## 2019-10-13 MED FILL — GEMFIBROZIL 600 MG TAB: 600 | 30 days supply | Qty: 60 | Fill #0

## 2019-10-13 MED FILL — VIT D2 1.25 MG (50,000 UNIT: 1.25 MG | 35 days supply | Qty: 5 | Fill #0

## 2019-10-13 NOTE — Progress Notes (Signed)
Patient [facility]  Established Patient Office Visit  Subjective:  Patient ID: Kathy Hamilton, female    DOB: 09-07-1966  Age: 53 y.o. MRN: [id number]  CC:  Chief Complaint  Patient presents with  . Follow-up    6 mth follow up  . Rash    left upper back / shoulder    HPI Kathy Hamilton is a 53 year old female who presents for Follow Up today.   Past Medical History:  Diagnosis Date  . Bronchitis   . Community acquired pneumonia   . H/O tubal ligation 09/2019  . Hypertriglyceridemia   . Premenopausal patient   . Seasonal allergies   . Shortness of breath    Current Status: Since her last office visit, she is doing well with no complaints. She has c/o skin rash, which she uses Hydrocortisone cream as needed. She is currently not taking Lopid as prescribed. She denies fevers, chills, fatigue, recent infections, weight loss, and night sweats. She has not had any headaches, visual changes, dizziness, and falls. No chest pain, heart palpitations, cough and shortness of breath reported. No reports of GI problems such as nausea, vomiting, diarrhea, and constipation. She has no reports of blood in stools, dysuria and hematuria. No depression or anxiety reported today. She denies pain today.   Past Surgical History:  Procedure Laterality Date  . CHOLECYSTECTOMY    . ORTHOPEDIC SURGERY Left 2013   rotator cuff  . TUBAL LIGATION      Family History  Problem Relation Age of Onset  . Cancer Mother   . Stroke Mother   . Heart attack Mother   . Bipolar disorder Sister   . Stroke Brother   . Asthma Daughter   . Bipolar disorder Daughter   . ADD / ADHD Son   . Asperger's syndrome Son     Social History   Socioeconomic History  . Marital status: Widowed    Spouse name: Not on file  . Number of children: Not on file  . Years of education: Not on file  . Highest education level: Not on file  Occupational History  . Not on file  Social Needs   . Financial resource strain: Not on file  . Food insecurity    Worry: Not on file    Inability: Not on file  . Transportation needs    Medical: Not on file    Non-medical: Not on file  Tobacco Use  . Smoking status: Former Research scientist (life sciences)  . Smokeless tobacco: Never Used  Substance and Sexual Activity  . Alcohol use: No  . Drug use: No  . Sexual activity: Yes  Lifestyle  . Physical activity    Days per week: Not on file    Minutes per session: Not on file  . Stress: Not on file  Relationships  . Social Herbalist on phone: Not on file    Gets together: Not on file    Attends religious service: Not on file    Active member of club or organization: Not on file    Attends meetings of clubs or organizations: Not on file    Relationship status: Not on file  . Intimate partner violence    Fear of current or ex partner: Not on file    Emotionally abused: Not on file    Physically abused: Not on file    Forced sexual activity: Not on file  Other Topics Concern  .  Not on file  Social History Narrative  . Not on file    Outpatient Medications Prior to Visit  Medication Sig Dispense Refill  . albuterol (PROVENTIL) (2.5 MG/3ML) 0.083% nebulizer solution Take 3 mLs (2.5 mg total) by nebulization every 6 (six) hours as needed for wheezing or shortness of breath. 75 mL 6  . cetirizine (ZYRTEC) 10 MG tablet Take 1 tablet (10 mg total) by mouth daily. 30 tablet 11  . albuterol (VENTOLIN HFA) 108 (90 Base) MCG/ACT inhaler Inhale 2 puffs into the lungs every 6 (six) hours as needed for wheezing or shortness of breath. 1 Inhaler 6  . budesonide-formoterol (SYMBICORT) 80-4.5 MCG/ACT inhaler Inhale 2 puffs into the lungs 2 (two) times daily. 1 Inhaler 6  . gemfibrozil (LOPID) 600 MG tablet Take 1 tablet (600 mg total) by mouth 2 (two) times daily before a meal. 60 tablet 3  . Vitamin D, Ergocalciferol, (DRISDOL) 1.25 MG (50000 UT) CAPS capsule Take 1 capsule (50,000 Units total) by mouth  every 7 (seven) days. 5 capsule 3  . cyclobenzaprine (FLEXERIL) 10 MG tablet Take 1 tablet by mouth before bed as needed for muscle spasm. Warning: May cause drowsiness. 10 tablet 0   No facility-administered medications prior to visit.     Allergies  Allergen Reactions  . Oxycodone     Unknown reaction     ROS Review of Systems  Constitutional: Negative.   HENT: Negative.   Eyes: Negative.   Respiratory: Positive for cough (occasional ) and shortness of breath (occasional on exertion).   Cardiovascular: Negative.   Gastrointestinal: Positive for abdominal distention.  Endocrine: Negative.   Genitourinary: Negative.   Musculoskeletal: Positive for arthralgias (generalized).  Skin: Negative.   Allergic/Immunologic: Negative.   Neurological: Negative.   Hematological: Negative.   Psychiatric/Behavioral: Negative.       Objective:    Physical Exam  Constitutional: She is oriented to person, place, and time. She appears well-developed and well-nourished.  HENT:  Head: Normocephalic and atraumatic.  Eyes: Conjunctivae are normal.  Neck: Normal range of motion. Neck supple.  Cardiovascular: Normal rate, regular rhythm, normal heart sounds and intact distal pulses.  Pulmonary/Chest: Breath sounds normal.      Abdominal: Soft. Bowel sounds are normal. She exhibits distension (obese).  Musculoskeletal: Normal range of motion.  Neurological: She is alert and oriented to person, place, and time. She has normal reflexes.  Skin: Skin is warm and dry.  Psychiatric: She has a normal mood and affect. Her behavior is normal. Judgment and thought content normal.  Nursing note and vitals reviewed.   BP (!) 148/96   Pulse 75   Temp 97.8 F (36.6 C) (Oral)   Ht  (1.6 m)   Wt 188 lb (85.3 kg)   SpO2 95%   BMI 33.30 kg/m  Wt Readings from Last 3 Encounters:  10/13/19 188 lb (85.3 kg)  04/11/19 187 lb (84.8 kg)  06/26/18 198 lb (89.8 kg)     Health Maintenance Due   Topic Date Due  . HIV Screening  06/29/1981  . COLONOSCOPY  06/29/2016  . PAP SMEAR-Modifier  12/25/2016  . MAMMOGRAM  05/01/2018  . INFLUENZA VACCINE  06/28/2019    There are no preventive care reminders to display for this patient.  Lab Results  Component Value Date   TSH 1.640 04/11/2019   Lab Results  Component Value Date   WBC 9.4 04/11/2019   HGB 14.9 04/11/2019   HCT 42.3 04/11/2019   MCV 88  04/11/2019   PLT 350 04/11/2019   Lab Results  Component Value Date   NA 136 04/11/2019   K 4.4 04/11/2019   CO2 20 04/11/2019   GLUCOSE 119 (H) 04/11/2019   BUN 13 04/11/2019   CREATININE 0.67 04/11/2019   BILITOT 0.3 04/11/2019   ALKPHOS 75 04/11/2019   AST 11 04/11/2019   ALT 13 04/11/2019   PROT 7.2 04/11/2019   ALBUMIN 4.5 04/11/2019   CALCIUM 9.7 04/11/2019   ANIONGAP 9 04/06/2018   Lab Results  Component Value Date   CHOL 179 04/11/2019   Lab Results  Component Value Date   HDL 47 04/11/2019   Lab Results  Component Value Date   LDLCALC 87 04/11/2019   Lab Results  Component Value Date   TRIG 227 (H) 04/11/2019   Lab Results  Component Value Date   CHOLHDL 3.8 04/11/2019   Lab Results  Component Value Date   HGBA1C 5.6 04/11/2019      Assessment & Plan:   1. Dyspnea on exertion Stable today. No signs or symptoms of respiratory distress noted or reported recently.  - albuterol (VENTOLIN HFA) 108 (90 Base) MCG/ACT inhaler; Inhale 2 puffs into the lungs every 6 (six) hours as needed for wheezing or shortness of breath.  Dispense: 8 g; Refill: 12 - budesonide-formoterol (SYMBICORT) 80-4.5 MCG/ACT inhaler; Inhale 2 puffs into the lungs 2 (two) times daily.  Dispense: 1 Inhaler; Refill: 6  2. Cough - albuterol (VENTOLIN HFA) 108 (90 Base) MCG/ACT inhaler; Inhale 2 puffs into the lungs every 6 (six) hours as needed for wheezing or shortness of breath.  Dispense: 8 g; Refill: 12 - budesonide-formoterol (SYMBICORT) 80-4.5 MCG/ACT inhaler; Inhale 2  puffs into the lungs 2 (two) times daily.  Dispense: 1 Inhaler; Refill: 6  3. Shortness of breath Stable.  - albuterol (VENTOLIN HFA) 108 (90 Base) MCG/ACT inhaler; Inhale 2 puffs into the lungs every 6 (six) hours as needed for wheezing or shortness of breath.  Dispense: 8 g; Refill: 12 - budesonide-formoterol (SYMBICORT) 80-4.5 MCG/ACT inhaler; Inhale 2 puffs into the lungs 2 (two) times daily.  Dispense: 1 Inhaler; Refill: 6  4. Hypertriglyceridemia - gemfibrozil (LOPID) 600 MG tablet; Take 1 tablet (600 mg total) by mouth 2 (two) times daily before a meal.  Dispense: 60 tablet; Refill: 6  5. Skin rash Left neck area. - doxycycline (VIBRA-TABS) 100 MG tablet; Take 1 tablet (100 mg total) by mouth 2 (two) times daily for 10 days.  Dispense: 20 tablet; Refill: 0  6. Skin infection We will initiate antibiotic today.  - doxycycline (VIBRA-TABS) 100 MG tablet; Take 1 tablet (100 mg total) by mouth 2 (two) times daily for 10 days.  Dispense: 20 tablet; Refill: 0  7. Vitamin D deficiency - Vitamin D, Ergocalciferol, (DRISDOL) 1.25 MG (50000 UT) CAPS capsule; Take 1 capsule (50,000 Units total) by mouth every 7 (seven) days.  Dispense: 5 capsule; Refill: 6  8. Healthcare maintenance - Urinalysis Dipstick  9. Follow up She will follow up in 6 months.   Meds ordered this encounter  Medications  . Vitamin D, Ergocalciferol, (DRISDOL) 1.25 MG (50000 UT) CAPS capsule    Sig: Take 1 capsule (50,000 Units total) by mouth every 7 (seven) days.    Dispense:  5 capsule    Refill:  6  . gemfibrozil (LOPID) 600 MG tablet    Sig: Take 1 tablet (600 mg total) by mouth 2 (two) times daily before a meal.    Dispense:  60 tablet    Refill:  6  . albuterol (VENTOLIN HFA) 108 (90 Base) MCG/ACT inhaler    Sig: Inhale 2 puffs into the lungs every 6 (six) hours as needed for wheezing or shortness of breath.    Dispense:  8 g    Refill:  12  . budesonide-formoterol (SYMBICORT) 80-4.5 MCG/ACT inhaler     Sig: Inhale 2 puffs into the lungs 2 (two) times daily.    Dispense:  1 Inhaler    Refill:  6  . doxycycline (VIBRA-TABS) 100 MG tablet    Sig: Take 1 tablet (100 mg total) by mouth 2 (two) times daily for 10 days.    Dispense:  20 tablet    Refill:  0   Orders Placed This Encounter  Procedures  . Urinalysis Dipstick    Referral Orders  No referral(s) requested today    Raliegh Ip,  MSN, FNP-BC [facility] [address] [place], [place] [address] [phone] fax  Problem List Items Addressed This Visit      Other   Dyspnea on exertion - Primary   Relevant Medications   albuterol (VENTOLIN HFA) 108 (90 Base) MCG/ACT inhaler   budesonide-formoterol (SYMBICORT) 80-4.5 MCG/ACT inhaler   Vitamin D deficiency   Relevant Medications   Vitamin D, Ergocalciferol, (DRISDOL) 1.25 MG (50000 UT) CAPS capsule    Other Visit Diagnoses    Cough       Relevant Medications   albuterol (VENTOLIN HFA) 108 (90 Base) MCG/ACT inhaler   budesonide-formoterol (SYMBICORT) 80-4.5 MCG/ACT inhaler   Shortness of breath       Relevant Medications   albuterol (VENTOLIN HFA) 108 (90 Base) MCG/ACT inhaler   budesonide-formoterol (SYMBICORT) 80-4.5 MCG/ACT inhaler   Hypertriglyceridemia       Relevant Medications   gemfibrozil (LOPID) 600 MG tablet   Skin rash       Relevant Medications   doxycycline (VIBRA-TABS) 100 MG tablet   Skin infection       Relevant Medications   doxycycline (VIBRA-TABS) 100 MG tablet   Healthcare maintenance       Relevant Orders   Urinalysis Dipstick (Completed)   Follow up          Meds ordered this encounter  Medications  . Vitamin D, Ergocalciferol, (DRISDOL) 1.25 MG (50000 UT) CAPS capsule    Sig: Take 1 capsule (50,000 Units total) by mouth every 7 (seven) days.    Dispense:  5 capsule    Refill:  6  . gemfibrozil (LOPID) 600 MG tablet    Sig: Take 1 tablet (600 mg  total) by mouth 2 (two) times daily before a meal.    Dispense:  60 tablet    Refill:  6  . albuterol (VENTOLIN HFA) 108 (90 Base) MCG/ACT inhaler    Sig: Inhale 2 puffs into the lungs every 6 (six) hours as needed for wheezing or shortness of breath.    Dispense:  8 g    Refill:  12  . budesonide-formoterol (SYMBICORT) 80-4.5 MCG/ACT inhaler    Sig: Inhale 2 puffs into the lungs 2 (two) times daily.    Dispense:  1 Inhaler    Refill:  6  . doxycycline (VIBRA-TABS) 100 MG tablet    Sig: Take 1 tablet (100 mg total) by mouth 2 (two) times daily for 10 days.    Dispense:  20 tablet    Refill:  0  Follow-up: Return in about 6 months (around 04/11/2020).    Kallie Locks, FNP

## 2019-10-13 NOTE — Patient Instructions (Signed)
     Doxycycline tablets or capsules What is this medicine? DOXYCYCLINE (dox i SYE kleen) is a tetracycline antibiotic. It kills certain bacteria or stops their growth. It is used to treat many kinds of infections, like dental, skin, respiratory, and urinary tract infections. It also treats acne, Lyme disease, malaria, and certain sexually transmitted infections. This medicine may be used for other purposes; ask your health care provider or pharmacist if you have questions. COMMON BRAND NAME(S): Acticlate, Adoxa, Adoxa CK, Adoxa Pak, Adoxa TT, Alodox, Avidoxy, Doxal, LYMEPAK, Mondoxyne NL, Monodox, Morgidox 1x, Morgidox 1x Kit, Morgidox 2x, Morgidox 2x Kit, NutriDox, Ocudox, TARGADOX, Vibra-Tabs, Vibramycin What should I tell my health care provider before I take this medicine? They need to know if you have any of these conditions:  liver disease  long exposure to sunlight like working outdoors  stomach problems like colitis  an unusual or allergic reaction to doxycycline, tetracycline antibiotics, other medicines, foods, dyes, or preservatives  pregnant or trying to get pregnant  breast-feeding How should I use this medicine? Take this medicine by mouth with a full glass of water. Follow the directions on the prescription label. It is best to take this medicine without food, but if it upsets your stomach take it with food. Take your medicine at regular intervals. Do not take your medicine more often than directed. Take all of your medicine as directed even if you think you are better. Do not skip doses or stop your medicine early. Talk to your pediatrician regarding the use of this medicine in children. While this drug may be prescribed for selected conditions, precautions do apply. Overdosage: If you think you have taken too much of this medicine contact a poison control center or emergency room at once. NOTE: This medicine is only for you. Do not share this medicine with  others. What if I miss a dose? If you miss a dose, take it as soon as you can. If it is almost time for your next dose, take only that dose. Do not take double or extra doses. What may interact with this medicine?  antacids  barbiturates  birth control pills  bismuth subsalicylate  carbamazepine  methoxyflurane  other antibiotics  phenytoin  vitamins that contain iron  warfarin This list may not describe all possible interactions. Give your health care provider a list of all the medicines, herbs, non-prescription drugs, or dietary supplements you use. Also tell them if you smoke, drink alcohol, or use illegal drugs. Some items may interact with your medicine. What should I watch for while using this medicine? Tell your doctor or health care professional if your symptoms do not improve. Do not treat diarrhea with over the counter products. Contact your doctor if you have diarrhea that lasts more than 2 days or if it is severe and watery. Do not take this medicine just before going to bed. It may not dissolve properly when you lay down and can cause pain in your throat. Drink plenty of fluids while taking this medicine to also help reduce irritation in your throat. This medicine can make you more sensitive to the sun. Keep out of the sun. If you cannot avoid being in the sun, wear protective clothing and use sunscreen. Do not use sun lamps or tanning beds/booths. Birth control pills may not work properly while you are taking this medicine. Talk to your doctor about using an extra method of birth control. If you are being treated for a sexually transmitted infection,   have finished your treatment. Your sexual partner may also need treatment. Avoid antacids, aluminum, calcium, magnesium, and iron products for 4 hours before and 2 hours after taking a dose of this medicine. If you are using this medicine to prevent malaria, you should still protect yourself from contact with  mosquitos. Stay in screened-in areas, use mosquito nets, keep your body covered, and use an insect repellent. What side effects may I notice from receiving this medicine? Side effects that you should report to your doctor or health care professional as soon as possible:  allergic reactions like skin rash, itching or hives, swelling of the face, lips, or tongue  difficulty breathing  fever  itching in the rectal or genital area  pain on swallowing  rash, fever, and swollen lymph nodes  redness, blistering, peeling or loosening of the skin, including inside the mouth  severe stomach pain or cramps  unusual bleeding or bruising  unusually weak or tired  yellowing of the eyes or skin Side effects that usually do not require medical attention (report to your doctor or health care professional if they continue or are bothersome):  diarrhea  loss of appetite  nausea, vomiting This list may not describe all possible side effects. Call your doctor for medical advice about side effects. You may report side effects to FDA at 1-800-FDA-1088. Where should I keep my medicine? Keep out of the reach of children. Store at room temperature, below 30 degrees C (86 degrees F). Protect from light. Keep container tightly closed. Throw away any unused medicine after the expiration date. Taking this medicine after the expiration date can make you seriously ill. NOTE: This sheet is a summary. It may not cover all possible information. If you have questions about this medicine, talk to your doctor, pharmacist, or health care provider.  2020 Elsevier/Gold Standard (2019-02-13 13:44:53) Cellulitis, Adult  Cellulitis is a skin infection. The infected area is often warm, red, swollen, and sore. It occurs most often in the arms and lower legs. It is very important to get treated for this condition. What are the causes? This condition is caused by bacteria. The bacteria enter through a break in the skin,  such as a cut, burn, insect bite, open sore, or crack. What increases the risk? This condition is more likely to occur in people who:  Have a weak body defense system (immune system).  Have open cuts, burns, bites, or scrapes on the skin.  Are older than 53 years of age.  Have a blood sugar problem (diabetes).  Have a long-lasting (chronic) liver disease (cirrhosis) or kidney disease.  Are very overweight (obese).  Have a skin problem, such as: ? Itchy rash (eczema). ? Slow movement of blood in the veins (venous stasis). ? Fluid buildup below the skin (edema).  Have been treated with high-energy rays (radiation).  Use IV drugs. What are the signs or symptoms? Symptoms of this condition include:  Skin that is: ? Red. ? Streaking. ? Spotting. ? Swollen. ? Sore or painful when you touch it. ? Warm.  A fever.  Chills.  Blisters. How is this diagnosed? This condition is diagnosed based on:  Medical history.  Physical exam.  Blood tests.  Imaging tests. How is this treated? Treatment for this condition may include:  Medicines to treat infections or allergies.  Home care, such as: ? Rest. ? Placing cold or warm cloths (compresses) on the skin.  Hospital care, if the condition is very bad. Follow these instructions at  home: Medicines  Take over-the-counter and prescription medicines only as told by your doctor.  If you were prescribed an antibiotic medicine, take it as told by your doctor. Do not stop taking it even if you start to feel better. General instructions   Drink enough fluid to keep your pee (urine) pale yellow.  Do not touch or rub the infected area.  Raise (elevate) the infected area above the level of your heart while you are sitting or lying down.  Place cold or warm cloths on the area as told by your doctor.  Keep all follow-up visits as told by your doctor. This is important. Contact a doctor if:  You have a fever.  You do not  start to get better after 1-2 days of treatment.  Your bone or joint under the infected area starts to hurt after the skin has healed.  Your infection comes back. This can happen in the same area or another area.  You have a swollen bump in the area.  You have new symptoms.  You feel ill and have muscle aches and pains. Get help right away if:  Your symptoms get worse.  You feel very sleepy.  You throw up (vomit) or have watery poop (diarrhea) for a long time.  You see red streaks coming from the area.  Your red area gets larger.  Your red area turns dark in color. These symptoms may represent a serious problem that is an emergency. Do not wait to see if the symptoms will go away. Get medical help right away. Call your local emergency services (911 in the [place] Do not drive yourself to the hospital. Summary  Cellulitis is a skin infection. The area is often warm, red, swollen, and sore.  This condition is treated with medicines, rest, and cold and warm cloths.  Take all medicines only as told by your doctor.  Tell your doctor if symptoms do not start to get better after 1-2 days of treatment. This information is not intended to replace advice given to you by your health care provider. Make sure you discuss any questions you have with your health care provider. Document Released: 05/01/2008 Document Revised: 04/04/2018 Document Reviewed: 04/04/2018 Elsevier Patient Education  2020 [employer].

## 2019-10-14 DIAGNOSIS — R21 Rash and other nonspecific skin eruption: Secondary | ICD-10-CM | POA: Insufficient documentation

## 2019-10-14 DIAGNOSIS — L089 Local infection of the skin and subcutaneous tissue, unspecified: Secondary | ICD-10-CM | POA: Insufficient documentation

## 2019-11-04 MED FILL — DOXYCYCLINE HYCLATE 100 MG: 100 | 10 days supply | Qty: 20 | Fill #0

## 2019-11-04 MED FILL — VENTOLIN HFA 90 MCG INHALER: 108 (90 BAS | 25 days supply | Qty: 18 | Fill #0

## 2019-11-04 MED FILL — GEMFIBROZIL 600 MG TAB: 600 | 30 days supply | Qty: 60 | Fill #0

## 2019-11-04 MED FILL — VIT D2 1.25 MG (50,000 UNIT: 1.25 MG | 35 days supply | Qty: 5 | Fill #0

## 2019-11-04 MED FILL — SYMBICORT 80-4.5 MCG INH: 80-4.5 | 30 days supply | Qty: 10 | Fill #0

## 2019-11-06 ENCOUNTER — Other Ambulatory Visit: Payer: Self-pay

## 2019-11-06 DIAGNOSIS — Z20822 Contact with and (suspected) exposure to covid-19: Secondary | ICD-10-CM

## 2019-11-08 ENCOUNTER — Telehealth: Payer: Self-pay | Admitting: Family Medicine

## 2019-11-08 LAB — NOVEL CORONAVIRUS, NAA: SARS-CoV-2, NAA: NOT DETECTED

## 2019-11-08 NOTE — Telephone Encounter (Signed)
Negative COVID results given. Patient results "NOT Detected." Caller expressed understanding. ° °

## 2019-11-29 ENCOUNTER — Other Ambulatory Visit: Payer: Self-pay

## 2019-11-29 ENCOUNTER — Emergency Department (HOSPITAL_COMMUNITY): Payer: No Typology Code available for payment source

## 2019-11-29 ENCOUNTER — Encounter (HOSPITAL_COMMUNITY): Payer: Self-pay

## 2019-11-29 ENCOUNTER — Emergency Department (HOSPITAL_COMMUNITY)
Admission: EM | Admit: 2019-11-29 | Discharge: 2019-11-29 | Disposition: A | Discharge status: Home / Self Care | Payer: No Typology Code available for payment source | Attending: Emergency Medicine | Admitting: Emergency Medicine

## 2019-11-29 DIAGNOSIS — K5792 Diverticulitis of intestine, part unspecified, without perforation or abscess without bleeding: Secondary | ICD-10-CM

## 2019-11-29 DIAGNOSIS — R109 Unspecified abdominal pain: Secondary | ICD-10-CM | POA: Diagnosis present

## 2019-11-29 DIAGNOSIS — R1030 Lower abdominal pain, unspecified: Secondary | ICD-10-CM | POA: Diagnosis not present

## 2019-11-29 DIAGNOSIS — K5732 Diverticulitis of large intestine without perforation or abscess without bleeding: Secondary | ICD-10-CM | POA: Diagnosis not present

## 2019-11-29 DIAGNOSIS — Z87891 Personal history of nicotine dependence: Secondary | ICD-10-CM | POA: Insufficient documentation

## 2019-11-29 DIAGNOSIS — Z79899 Other long term (current) drug therapy: Secondary | ICD-10-CM | POA: Diagnosis not present

## 2019-11-29 DIAGNOSIS — X500XXA Overexertion from strenuous movement or load, initial encounter: Secondary | ICD-10-CM | POA: Insufficient documentation

## 2019-11-29 LAB — CBC WITH DIFFERENTIAL/PLATELET
Abs Immature Granulocytes: 0.03 10*3/uL (ref 0.00–0.07)
Basophils Absolute: 0.1 10*3/uL (ref 0.0–0.1)
Basophils Relative: 1 %
Eosinophils Absolute: 0.2 10*3/uL (ref 0.0–0.5)
Eosinophils Relative: 2 %
HCT: 44.3 % (ref 36.0–46.0)
Hemoglobin: 14.4 g/dL (ref 12.0–15.0)
Immature Granulocytes: 0 %
Lymphocytes Relative: 22 %
Lymphs Abs: 2.2 10*3/uL (ref 0.7–4.0)
MCH: 30.4 pg (ref 26.0–34.0)
MCHC: 32.5 g/dL (ref 30.0–36.0)
MCV: 93.5 fL (ref 80.0–100.0)
Monocytes Absolute: 0.5 10*3/uL (ref 0.1–1.0)
Monocytes Relative: 5 %
Neutro Abs: 6.9 10*3/uL (ref 1.7–7.7)
Neutrophils Relative %: 70 %
Platelets: 360 10*3/uL (ref 150–400)
RBC: 4.74 MIL/uL (ref 3.87–5.11)
RDW: 13.4 % (ref 11.5–15.5)
WBC: 9.8 10*3/uL (ref 4.0–10.5)
nRBC: 0 % (ref 0.0–0.2)

## 2019-11-29 LAB — COMPREHENSIVE METABOLIC PANEL
ALT: 17 U/L (ref 0–44)
AST: 17 U/L (ref 15–41)
Albumin: 3.8 g/dL (ref 3.5–5.0)
Alkaline Phosphatase: 60 U/L (ref 38–126)
Anion gap: 10 (ref 5–15)
BUN: 12 mg/dL (ref 6–20)
CO2: 24 mmol/L (ref 22–32)
Calcium: 9.3 mg/dL (ref 8.9–10.3)
Chloride: 105 mmol/L (ref 98–111)
Creatinine, Ser: 0.56 mg/dL (ref 0.44–1.00)
GFR calc Af Amer: >60 mL/min (ref >60)
GFR calc non Af Amer: >60 mL/min (ref >60)
Glucose, Bld: 148 mg/dL — ABNORMAL HIGH (ref 70–99)
Potassium: 4 mmol/L (ref 3.5–5.1)
Sodium: 139 mmol/L (ref 135–145)
Total Bilirubin: 0.4 mg/dL (ref 0.3–1.2)
Total Protein: 7.8 g/dL (ref 6.5–8.1)

## 2019-11-29 LAB — LIPASE, BLOOD: Lipase: 23 U/L (ref 11–51)

## 2019-11-29 MED ORDER — SODIUM CHLORIDE (PF) 0.9 % IJ SOLN
INTRAMUSCULAR | Status: AC
  Filled 2019-11-29: qty 50

## 2019-11-29 MED ORDER — CIPROFLOXACIN HCL 500 MG PO TABS: 500.0000 mg | ORAL_TABLET | Freq: Two times a day (BID) | ORAL | 0 refills | Status: DC

## 2019-11-29 MED ORDER — METRONIDAZOLE 500 MG PO TABS: 500.0000 mg | ORAL_TABLET | Freq: Three times a day (TID) | ORAL | 0 refills | Status: AC

## 2019-11-29 MED ORDER — IOHEXOL 300 MG/ML  SOLN
100.0000 mL | Freq: Once | INTRAMUSCULAR | Status: AC | PRN
  Administered 2019-11-29: 100 mL via INTRAVENOUS

## 2019-11-29 MED ORDER — CIPROFLOXACIN HCL 500 MG PO TABS: 500.0000 mg | ORAL_TABLET | Freq: Two times a day (BID) | ORAL | 0 refills | Status: AC

## 2019-11-29 MED ORDER — METRONIDAZOLE 500 MG PO TABS: 500.0000 mg | ORAL_TABLET | Freq: Three times a day (TID) | ORAL | 0 refills | Status: DC

## 2019-11-29 NOTE — ED Provider Notes (Signed)
[facility]   CSN: [id number] Arrival date & time: 11/29/19  1039     History Chief Complaint  Patient presents with  . Abdominal Pain    Workers Comp    Kathy Hamilton is a 54 y.o. female.  54 y.o female with no PMH presents to the ED with a chief complaint of abdominal pain x 3 weeks. 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 She reports approximately 2 weeks ago when she was lifting this heavy pallets she felt a sensation of pulling over on her lower abdomen.  She describes this has been ongoing for the past 3 weeks, this worsened yesterday.  States she has been taking Tylenol to help with her symptoms without improvement.  Does report the pain is worse at night along with walking up the stairs.  She does endorse the pain is better with heating pad.She denies any nausea, vomiting, weakness, urinary or bowel complaints.   The history is provided by the patient.       Past Medical History:  Diagnosis Date  . Bronchitis   . Community acquired pneumonia   . H/O tubal ligation 09/2019  . Hypertriglyceridemia   . Premenopausal patient   . Seasonal allergies   . Shortness of breath     Patient Active Problem List   Diagnosis Date Noted  . Skin rash 10/14/2019  . Skin infection 10/14/2019  . Irregular periods 04/11/2019  . Premenopausal patient 04/11/2019  . Dyspnea on exertion 04/11/2019  . Seasonal allergies 04/11/2019  . Vitamin D deficiency 04/11/2019  . Family history of diabetes mellitus 03/27/2016  . Family history of hypercholesterolemia 03/27/2016    Past Surgical History:  Procedure Laterality Date  . CHOLECYSTECTOMY    . ORTHOPEDIC SURGERY Left 2013   rotator cuff  . TUBAL LIGATION       OB History    Gravida  5   Para      Term      Preterm      AB      Living  4     SAB      TAB      Ectopic      Multiple      Live Births             Family History  Problem Relation Age of Onset  . Cancer Mother   . Stroke Mother   . Heart attack Mother   . Bipolar disorder Sister   . Stroke Brother   . Asthma Daughter   . Bipolar disorder Daughter   . ADD / ADHD Son   . Asperger's syndrome Son     Social History   Tobacco Use  . Smoking status: Former Games developer  . Smokeless tobacco: Never Used  Substance Use Topics  . Alcohol use: No  . Drug use: No    Home Medications Prior to Admission medications   Medication Sig Start Date End Date Taking? Authorizing Provider  albuterol (PROVENTIL) (2.5 MG/3ML) 0.083% nebulizer solution Take 3 mLs (2.5 mg total) by nebulization every 6 (six) hours as needed for wheezing or shortness of breath. 04/11/19   Kallie Locks, FNP  albuterol (VENTOLIN HFA) 108 (90 Base) MCG/ACT inhaler Inhale 2 puffs into the lungs every 6 (six) hours as needed for wheezing or shortness of breath. 10/13/19   Kallie Locks, FNP  budesonide-formoterol (SYMBICORT) 80-4.5 MCG/ACT inhaler Inhale  2 puffs into the lungs 2 (two) times daily. 10/13/19   Kallie Locks, FNP  cetirizine (ZYRTEC) 10 MG tablet Take 1 tablet (10 mg total) by mouth daily. 04/11/19   Kallie Locks, FNP  ciprofloxacin (CIPRO) 500 MG tablet Take 1 tablet (500 mg total) by mouth 2 (two) times daily for 7 days. 11/29/19 12/06/19  Claude Manges, PA-C  gemfibrozil (LOPID) 600 MG tablet Take 1 tablet (600 mg total) by mouth 2 (two) times daily before a meal. 10/13/19   Kallie Locks, FNP  metroNIDAZOLE (FLAGYL) 500 MG tablet Take 1 tablet (500 mg total) by mouth 3 (three) times daily for 7 days. 11/29/19 12/06/19  Claude Manges, PA-C  Vitamin D, Ergocalciferol, (DRISDOL) 1.25 MG (50000 UT) CAPS capsule Take 1 capsule (50,000 Units total) by mouth every 7 (seven) days. 10/13/19   Kallie Locks, FNP    Allergies    Oxycodone  Review of Systems   Review of Systems  Constitutional: Negative for fever.  HENT: Negative for sore  throat.   Respiratory: Negative for shortness of breath.   Gastrointestinal: Positive for abdominal pain. Negative for vomiting.  Genitourinary: Negative for dysuria and flank pain.  Musculoskeletal: Positive for myalgias.  Skin: Negative for pallor and wound.  Neurological: Negative for light-headedness and headaches.    Physical Exam Updated Vital Signs BP (!) 173/93 (BP Location: Left Arm)   Pulse 93   Temp 99 F (37.2 C) (Oral)   Resp 18   SpO2 98%   Physical Exam Vitals and nursing note reviewed.  Constitutional:      Appearance: She is well-developed. She is not ill-appearing.  HENT:     Head: Normocephalic and atraumatic.  Cardiovascular:     Rate and Rhythm: Normal rate.  Pulmonary:     Effort: Pulmonary effort is normal.     Breath sounds: Normal breath sounds. No wheezing, rhonchi or rales.  Abdominal:     Tenderness: There is abdominal tenderness in the suprapubic area. There is rebound. There is no right CVA tenderness or left CVA tenderness.     Hernia: There is no hernia in the umbilical area, ventral area, left inguinal area, right femoral area, left femoral area or right inguinal area.     Comments: Abdomen is soft, palpable tenderness along the suprapubic region.  No hernia noted on my exam.  Skin:    General: Skin is warm and dry.  Neurological:     Mental Status: She is alert.     ED Results / Procedures / Treatments   Labs (all labs ordered are listed, but only abnormal results are displayed) Labs Reviewed  COMPREHENSIVE METABOLIC PANEL - Abnormal; Notable for the following components:      Result Value   Glucose, Bld 148 (*)    All other components within normal limits  CBC WITH DIFFERENTIAL/PLATELET  LIPASE, BLOOD    EKG None  Radiology CT ABDOMEN PELVIS W CONTRAST  Result Date: 11/29/2019 CLINICAL DATA:  Acute lower abdominal pain. EXAM: CT ABDOMEN AND PELVIS WITH CONTRAST TECHNIQUE: Multidetector CT imaging of the abdomen and pelvis was  performed using the standard protocol following bolus administration of intravenous contrast. CONTRAST:  OMNIPAQUE IOHEXOL 300 MG/ML  SOLN COMPARISON:  None. FINDINGS: Lower chest: No acute abnormality. Hepatobiliary: No focal liver abnormality is seen. Status post cholecystectomy. No biliary dilatation. Pancreas: Unremarkable. No pancreatic ductal dilatation or surrounding inflammatory changes. Spleen: Normal in size without focal abnormality. Adrenals/Urinary Tract: Adrenal glands are unremarkable.  Kidneys are normal, without renal calculi, focal lesion, or hydronephrosis. Bladder is unremarkable. Stomach/Bowel: Stomach and appendix are unremarkable. There is no evidence of bowel obstruction. Focal proximal sigmoid diverticulitis is noted without abscess formation. 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 No enlarged abdominal or pelvic lymph nodes. Reproductive: Uterus and bilateral adnexa are unremarkable. Other: No abdominal wall hernia or abnormality. No abdominopelvic ascites. Musculoskeletal: No acute or significant osseous findings. IMPRESSION: Focal proximal sigmoid diverticulitis is noted without abscess formation. 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 Electronically Signed   By: Lupita Raider M.D.   On: 11/29/2019 14:07    Procedures Procedures (including critical care time)  Medications Ordered in ED Medications  sodium chloride (PF) 0.9 % injection (has no administration in time range)  iohexol (OMNIPAQUE) 300 MG/ML solution 100 mL (100 mLs Intravenous Contrast Given 11/29/19 1342)    ED Course  I have reviewed the triage vital signs and the nursing notes.  Pertinent labs & imaging results that were available during my care of the patient were reviewed by me and considered in my medical  decision making (see chart for details).    MDM Rules/Calculators/A&P   Patient with no pertinent past medical history presents to ED with complaints of lower abdominal pain after lifting heavy pallets at work.  She reports this occurred about 3 weeks ago, has had worsening pain along the lower abdomen, she feels a pulling sensation every time she ambulates along with goes upstairs.  During my evaluation patient is well-appearing, vitals are within normal limits.  She does endorse a pulling sensation to the lower abdomen, my exam abdomen is soft, bowel sounds are present, no palpable hernia.She's had no infectious signs, no nausea, no vomiting or fevers. No urinary complaints.   CBC was found to be without any leukocytosis, hemoglobin is unremarkable without any signs of anemia.  CMP without any electrolyte derangement, creatinine level is within normal limits.  LFTs are unremarkable, pain seems to be more so on the lower abdomen, lower suspicion for any gallbladder pathology.  Lipase level is within normal limits. CT abdomen obtained to evaluate hernia versus further pathology:  Focal proximal sigmoid diverticulitis is noted without abscess  formation.    12 mm fusiform aneurysm seen involving upper lobe branch of right  renal artery. 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     These results were discussed at length with patient, she voices understanding.  However, she reports this pain that worsened after lifting heavy at work, explained to her that it is unlikely.diverticulitis likely was caused due to heavy lifting at work.  I have explained to patient that I will send her home with antibiotic therapy along with pain medication, however she is allergic to oxycodone, unknown allergy, will have her try over-the-counter medication.  She will also be provided with a CT report copy, this will be to follow-up for her right renal artery aneurysm.  Patient understands and agrees with  management, no abscess, fever, vitals are within normal limits.  She is stable for discharge.   Portions of this note were generated with Scientist, clinical (histocompatibility and immunogenetics). Dictation errors may occur despite best attempts at proofreading.  Final Clinical Impression(s) / ED Diagnoses Final diagnoses:  Diverticulitis  Lower abdominal pain    Rx / DC Orders ED  Discharge Orders         Ordered    metroNIDAZOLE (FLAGYL) 500 MG tablet  3 times daily,   Status:  Discontinued     11/29/19 1450    ciprofloxacin (CIPRO) 500 MG tablet  2 times daily,   Status:  Discontinued     11/29/19 1450    ciprofloxacin (CIPRO) 500 MG tablet  2 times daily     11/29/19 1452    metroNIDAZOLE (FLAGYL) 500 MG tablet  3 times daily     11/29/19 1452           Janeece Fitting, PA-C 11/29/19 1458    Margette Fast, MD 11/29/19 2045

## 2019-11-29 NOTE — Discharge Instructions (Addendum)
Your CT abdomen and pelvis showed diverticulitis, I have prescribed two different antibiotics.  Flagyl will need to be taken 1 tablet twice a day for the next 7 days.  Cipro will  need to be taken 1 tablet three times a day for the next 7 days.   You  may alternate ibuprofen of tylenol to help with the pain.   If you experience any fever, blood in your stool please return to the ED.   Your CT report also showed an incidental right renal artery aneurysm, a copy has been provided to follow up with your PCP.

## 2019-11-29 NOTE — ED Notes (Signed)
Johana PA at bedside

## 2019-11-29 NOTE — ED Notes (Signed)
Pt verbalizes understanding of DC instructions. Pt belongings returned and is ambulatory out of ED.  

## 2019-11-29 NOTE — ED Triage Notes (Addendum)
Pt arrived POV ambulatory into ED from home CC lower abd pain r/t Worker comp incident "2 weeks ago". 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 Pt reports taking tylenol with some relief last night. Pt denies GI GU issues. Pts son is also being seen in [facility] today.

## 2019-12-01 MED FILL — CIPROFLOXACIN HCL 500 MG TA: 500 | 7 days supply | Qty: 14 | Fill #0

## 2019-12-01 MED FILL — metroNIDAZOLE 500 MG TABS: 500 | 7 days supply | Qty: 21 | Fill #0

## 2019-12-04 MED FILL — VIT D2 1.25 MG (50,000 UNIT: 1.25 MG | 35 days supply | Qty: 5 | Fill #1

## 2020-01-29 ENCOUNTER — Ambulatory Visit
Admission: EM | Admit: 2020-01-29 | Discharge: 2020-01-29 | Disposition: A | Discharge status: Home / Self Care | Payer: Self-pay | Attending: Emergency Medicine | Admitting: Emergency Medicine

## 2020-01-29 ENCOUNTER — Other Ambulatory Visit: Payer: Self-pay

## 2020-01-29 ENCOUNTER — Encounter: Payer: Self-pay | Admitting: Emergency Medicine

## 2020-01-29 DIAGNOSIS — L03116 Cellulitis of left lower limb: Secondary | ICD-10-CM

## 2020-01-29 DIAGNOSIS — W57XXXA Bitten or stung by nonvenomous insect and other nonvenomous arthropods, initial encounter: Secondary | ICD-10-CM

## 2020-01-29 MED ORDER — DOXYCYCLINE HYCLATE 100 MG PO CAPS: 100.0000 mg | ORAL_CAPSULE | Freq: Two times a day (BID) | ORAL | 0 refills | Status: AC

## 2020-01-29 NOTE — Discharge Instructions (Addendum)
Keep area(s) clean and dry. °Apply hot compress / towel for 5-10 minutes 3-5 times daily. °Take antibiotic as prescribed with food - important to complete course. °Return for worsening pain, redness, swelling, discharge, fever. ° °Helpful prevention tips: °Keep nails short to avoid secondary skin infections. °Use new, clean razors when shaving. °Avoid antiperspirants - look for deodorants without aluminum. °Avoid wearing underwire bras as this can irritate the area further.  °

## 2020-01-29 NOTE — ED Provider Notes (Signed)
[facility]    CSN: [id number] Arrival date & time: 01/29/20  1613      History   Chief Complaint Chief Complaint  Patient presents with  . Insect Bite    HPI Kathy Hamilton is a 54 y.o. female presenting for evaluation of bug bite that occurred to left upper hamstring 3 days ago.  Patient thinks that it may have been a spider, though did not see specimen.  Patient dressing pain, swelling.  Was able to "mash on it "the other day with a small amount of white discharge.  Has used warm wash rag infrequently with some pain relief.  Patient denies fever, arthralgias, myalgias.    Past Medical History:  Diagnosis Date  . Bronchitis   . Community acquired pneumonia   . H/O tubal ligation 09/2019  . Hypertriglyceridemia   . Premenopausal patient   . Seasonal allergies   . Shortness of breath     Patient Active Problem List   Diagnosis Date Noted  . Skin rash 10/14/2019  . Skin infection 10/14/2019  . Irregular periods 04/11/2019  . Premenopausal patient 04/11/2019  . Dyspnea on exertion 04/11/2019  . Seasonal allergies 04/11/2019  . Vitamin D deficiency 04/11/2019  . Family history of diabetes mellitus 03/27/2016  . Family history of hypercholesterolemia 03/27/2016    Past Surgical History:  Procedure Laterality Date  . CHOLECYSTECTOMY    . ORTHOPEDIC SURGERY Left 2013   rotator cuff  . TUBAL LIGATION      OB History    Gravida  5   Para      Term      Preterm      AB      Living  4     SAB      TAB      Ectopic      Multiple      Live Births               Home Medications    Prior to Admission medications   Medication Sig Start Date End Date Taking? Authorizing Provider  albuterol (PROVENTIL) (2.5 MG/3ML) 0.083% nebulizer solution Take 3 mLs (2.5 mg total) by nebulization every 6 (six) hours as needed for wheezing or shortness of breath. 04/11/19   Kallie Locks, FNP  albuterol (VENTOLIN HFA) 108 (90 Base) MCG/ACT inhaler  Inhale 2 puffs into the lungs every 6 (six) hours as needed for wheezing or shortness of breath. 10/13/19   Kallie Locks, FNP  budesonide-formoterol (SYMBICORT) 80-4.5 MCG/ACT inhaler Inhale 2 puffs into the lungs 2 (two) times daily. 10/13/19   Kallie Locks, FNP  cetirizine (ZYRTEC) 10 MG tablet Take 1 tablet (10 mg total) by mouth daily. 04/11/19   Kallie Locks, FNP  doxycycline (VIBRAMYCIN) 100 MG capsule Take 1 capsule (100 mg total) by mouth 2 (two) times daily for 7 days. 01/29/20 02/05/20  Hall-Potvin, [place], PA-C  gemfibrozil (LOPID) 600 MG tablet Take 1 tablet (600 mg total) by mouth 2 (two) times daily before a meal. 10/13/19   Kallie Locks, FNP  Vitamin D, Ergocalciferol, (DRISDOL) 1.25 MG (50000 UT) CAPS capsule Take 1 capsule (50,000 Units total) by mouth every 7 (seven) days. 10/13/19   Kallie Locks, FNP    Family History Family History  Problem Relation Age of Onset  . Cancer Mother   . Stroke Mother   . Heart attack Mother   . Bipolar disorder Sister   . Stroke Brother   .  Asthma Daughter   . Bipolar disorder Daughter   . ADD / ADHD Son   . Asperger's syndrome Son     Social History Social History   Tobacco Use  . Smoking status: Former Research scientist (life sciences)  . Smokeless tobacco: Never Used  Substance Use Topics  . Alcohol use: No  . Drug use: No     Allergies   Oxycodone   Review of Systems As per HPI   Physical Exam Triage Vital Signs ED Triage Vitals  Enc Vitals Group     BP 01/29/20 1623 (!) 154/76     Pulse Rate 01/29/20 1623 92     Resp 01/29/20 1623 16     Temp 01/29/20 1623 98.3 F (36.8 C)     Temp Source 01/29/20 1623 Temporal     SpO2 01/29/20 1623 97 %     Weight --      Height --      Head Circumference --      Peak Flow --      Pain Score 01/29/20 1622 8     Pain Loc --      Pain Edu? --      Excl. in [place]? --    No data found.  Updated Vital Signs BP (!) 154/76 (BP Location: Left Arm)   Pulse 92   Temp 98.3 F  (36.8 C) (Temporal)   Resp 16   LMP 12/30/2019   SpO2 97%   Visual Acuity Right Eye Distance:   Left Eye Distance:   Bilateral Distance:    Right Eye Near:   Left Eye Near:    Bilateral Near:     Physical Exam Constitutional:      General: She is not in acute distress. HENT:     Head: Normocephalic and atraumatic.  Eyes:     General: No scleral icterus.    Pupils: Pupils are equal, round, and reactive to light.  Cardiovascular:     Rate and Rhythm: Normal rate.  Pulmonary:     Effort: Pulmonary effort is normal.  Skin:    Coloration: Skin is not jaundiced or pale.     Comments: Left upper thigh 4 x 3 cm area of induration with overlying erythema, exquisite TTP.  Trace warmth.  Central superficial puncture wound without discharge.  No streaking  Neurological:     Mental Status: She is alert and oriented to person, place, and time.      UC Treatments / Results  Labs (all labs ordered are listed, but only abnormal results are displayed) Labs Reviewed - No data to display  EKG   Radiology No results found.  Procedures Procedures (including critical care time)  Medications Ordered in UC Medications - No data to display  Initial Impression / Assessment and Plan / UC Course  I have reviewed the triage vital signs and the nursing notes.  Pertinent labs & imaging results that were available during my care of the patient were reviewed by me and considered in my medical decision making (see chart for details).     Patient afebrile, nontoxic in office today.  Exam consistent with cellulitis s/p unknown bug bite versus spider bite.  Will start doxycycline today, increase frequency of hot compress use, and have patient follow-up with PCP in 1 week.  Return precautions discussed, patient verbalized understanding and is agreeable to plan. Final Clinical Impressions(s) / UC Diagnoses   Final diagnoses:  Cellulitis of left lower extremity  Bug bite, initial encounter  Discharge Instructions     Keep area(s) clean and dry. Apply hot compress / towel for 5-10 minutes 3-5 times daily. Take antibiotic as prescribed with food - important to complete course. Return for worsening pain, redness, swelling, discharge, fever.  Helpful prevention tips: Keep nails short to avoid secondary skin infections. Use new, clean razors when shaving. Avoid antiperspirants - look for deodorants without aluminum. Avoid wearing underwire bras as this can irritate the area further.     ED Prescriptions    Medication Sig Dispense Auth. Provider   doxycycline (VIBRAMYCIN) 100 MG capsule Take 1 capsule (100 mg total) by mouth 2 (two) times daily for 7 days. 14 capsule Hall-Potvin, [place], PA-C     PDMP not reviewed this encounter.   Hall-Potvin, [place], [place] 01/29/20 1747

## 2020-01-29 NOTE — ED Notes (Signed)
Patient able to ambulate independently  

## 2020-01-29 NOTE — ED Triage Notes (Signed)
Pt presents to [facility] for assessment after being bit three days ago by a bug.  Patient states she has been trying to squeeze stuff out of it with only a mild amount coming out.  C/o increasing pain.

## 2020-01-30 ENCOUNTER — Telehealth: Payer: Self-pay | Admitting: Emergency Medicine

## 2020-01-30 NOTE — Telephone Encounter (Signed)
Left voicemail checking in on patient, and encouraged return call with any continuing questions or concerns.    

## 2020-03-20 ENCOUNTER — Emergency Department (HOSPITAL_COMMUNITY): Payer: Self-pay

## 2020-03-20 ENCOUNTER — Other Ambulatory Visit: Payer: Self-pay

## 2020-03-20 ENCOUNTER — Emergency Department (HOSPITAL_COMMUNITY)
Admission: EM | Admit: 2020-03-20 | Discharge: 2020-03-20 | Disposition: A | Discharge status: Home / Self Care | Payer: Self-pay | Attending: Emergency Medicine | Admitting: Emergency Medicine

## 2020-03-20 ENCOUNTER — Encounter (HOSPITAL_COMMUNITY): Payer: Self-pay | Admitting: *Deleted

## 2020-03-20 DIAGNOSIS — X500XXA Overexertion from strenuous movement or load, initial encounter: Secondary | ICD-10-CM | POA: Insufficient documentation

## 2020-03-20 DIAGNOSIS — Z79899 Other long term (current) drug therapy: Secondary | ICD-10-CM | POA: Insufficient documentation

## 2020-03-20 DIAGNOSIS — Y929 Unspecified place or not applicable: Secondary | ICD-10-CM | POA: Insufficient documentation

## 2020-03-20 DIAGNOSIS — R1030 Lower abdominal pain, unspecified: Secondary | ICD-10-CM | POA: Insufficient documentation

## 2020-03-20 DIAGNOSIS — Z87891 Personal history of nicotine dependence: Secondary | ICD-10-CM | POA: Insufficient documentation

## 2020-03-20 DIAGNOSIS — Y999 Unspecified external cause status: Secondary | ICD-10-CM | POA: Insufficient documentation

## 2020-03-20 DIAGNOSIS — S39012A Strain of muscle, fascia and tendon of lower back, initial encounter: Secondary | ICD-10-CM | POA: Insufficient documentation

## 2020-03-20 DIAGNOSIS — R42 Dizziness and giddiness: Secondary | ICD-10-CM | POA: Insufficient documentation

## 2020-03-20 DIAGNOSIS — Y9389 Activity, other specified: Secondary | ICD-10-CM | POA: Insufficient documentation

## 2020-03-20 LAB — COMPREHENSIVE METABOLIC PANEL
ALT: 20 U/L (ref 0–44)
AST: 21 U/L (ref 15–41)
Albumin: 4.2 g/dL (ref 3.5–5.0)
Alkaline Phosphatase: 62 U/L (ref 38–126)
Anion gap: 8 (ref 5–15)
BUN: 10 mg/dL (ref 6–20)
CO2: 27 mmol/L (ref 22–32)
Calcium: 9 mg/dL (ref 8.9–10.3)
Chloride: 105 mmol/L (ref 98–111)
Creatinine, Ser: 0.61 mg/dL (ref 0.44–1.00)
GFR calc Af Amer: >60 mL/min (ref >60)
GFR calc non Af Amer: >60 mL/min (ref >60)
Glucose, Bld: 105 mg/dL — ABNORMAL HIGH (ref 70–99)
Potassium: 3.9 mmol/L (ref 3.5–5.1)
Sodium: 140 mmol/L (ref 135–145)
Total Bilirubin: 0.4 mg/dL (ref 0.3–1.2)
Total Protein: 7.5 g/dL (ref 6.5–8.1)

## 2020-03-20 LAB — URINALYSIS, ROUTINE W REFLEX MICROSCOPIC
Bilirubin Urine: NEGATIVE
Glucose, UA: NEGATIVE mg/dL
Hgb urine dipstick: NEGATIVE
Ketones, ur: 5 mg/dL — AB
Leukocytes,Ua: NEGATIVE
Nitrite: NEGATIVE
Protein, ur: NEGATIVE mg/dL
Specific Gravity, Urine: 1.027 (ref 1.005–1.030)
pH: 5 (ref 5.0–8.0)

## 2020-03-20 LAB — CBC
HCT: 42.8 % (ref 36.0–46.0)
Hemoglobin: 13.5 g/dL (ref 12.0–15.0)
MCH: 29.5 pg (ref 26.0–34.0)
MCHC: 31.5 g/dL (ref 30.0–36.0)
MCV: 93.7 fL (ref 80.0–100.0)
Platelets: 373 10*3/uL (ref 150–400)
RBC: 4.57 MIL/uL (ref 3.87–5.11)
RDW: 13.8 % (ref 11.5–15.5)
WBC: 10 10*3/uL (ref 4.0–10.5)
nRBC: 0 % (ref 0.0–0.2)

## 2020-03-20 LAB — LIPASE, BLOOD: Lipase: 24 U/L (ref 11–51)

## 2020-03-20 LAB — I-STAT BETA HCG BLOOD, ED (MC, WL, AP ONLY): I-stat hCG, quantitative: <5 m[IU]/mL (ref <5)

## 2020-03-20 MED ORDER — SODIUM CHLORIDE 0.9% FLUSH: 3.0000 mL | Freq: Once | INTRAVENOUS | Status: DC

## 2020-03-20 MED ORDER — MECLIZINE HCL 25 MG PO TABS: 25.0000 mg | ORAL_TABLET | Freq: Three times a day (TID) | ORAL | 0 refills | Status: AC | PRN

## 2020-03-20 MED ORDER — KETOROLAC TROMETHAMINE 15 MG/ML IJ SOLN
15.0000 mg | Freq: Once | INTRAMUSCULAR | Status: AC
  Administered 2020-03-20: 15 mg via INTRAVENOUS
  Filled 2020-03-20: qty 1

## 2020-03-20 MED ORDER — IOHEXOL 300 MG/ML  SOLN
100.0000 mL | Freq: Once | INTRAMUSCULAR | Status: AC | PRN
  Administered 2020-03-20: 17:00:00 100 mL via INTRAVENOUS

## 2020-03-20 MED ORDER — ONDANSETRON HCL 4 MG/2ML IJ SOLN
4.0000 mg | Freq: Once | INTRAMUSCULAR | Status: AC
  Administered 2020-03-20: 4 mg via INTRAVENOUS
  Filled 2020-03-20: qty 2

## 2020-03-20 MED ORDER — MECLIZINE HCL 25 MG PO TABS
25.0000 mg | ORAL_TABLET | Freq: Once | ORAL | Status: AC
  Administered 2020-03-20: 25 mg via ORAL
  Filled 2020-03-20: qty 1

## 2020-03-20 MED ORDER — SODIUM CHLORIDE 0.9 % IV BOLUS
1000.0000 mL | Freq: Once | INTRAVENOUS | Status: AC
  Administered 2020-03-20: 1000 mL via INTRAVENOUS

## 2020-03-20 NOTE — ED Triage Notes (Signed)
Generalized abd and back pain worse over last two weeks. 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

## 2020-03-20 NOTE — ED Provider Notes (Signed)
[facility]-EMERGENCY DEPT Provider Note   CSN: [id number] Arrival date & time: 03/20/20  1312     History Chief Complaint  Patient presents with  . Abdominal Pain  . Back Pain  . Head Pressure    Kathy Hamilton is a 54 y.o. female.  54 y.o female with a PMH of diverticulitis presents to the ED with complaints of abdominal pain for the past 2 to 3 months, worsening this past week.  Patient describes this as a cramping sensation to the lower abdomen with radiation to the epigastric region.  He reports taking Tylenol for the symptoms without improvement.  She has a prior surgical history of cholecystectomy.  She also endorses nausea, also reports a sharp pain to the thoracic spine, more so paraspinal.  She endorses a headache, states that she is felt very dizzy in the last couple of days, states the symptoms are exacerbated with activity at work, she is currently lifting and picking up heavy boxes.  She denies any fevers, vomiting, diarrhea, chest pain, shortness of breath or urinary symptoms.  The history is provided by the patient.       Past Medical History:  Diagnosis Date  . Bronchitis   . Community acquired pneumonia   . H/O tubal ligation 09/2019  . Hypertriglyceridemia   . Premenopausal patient   . Seasonal allergies   . Shortness of breath     Patient Active Problem List   Diagnosis Date Noted  . Skin rash 10/14/2019  . Skin infection 10/14/2019  . Irregular periods 04/11/2019  . Premenopausal patient 04/11/2019  . Dyspnea on exertion 04/11/2019  . Seasonal allergies 04/11/2019  . Vitamin D deficiency 04/11/2019  . Family history of diabetes mellitus 03/27/2016  . Family history of hypercholesterolemia 03/27/2016    Past Surgical History:  Procedure Laterality Date  . CHOLECYSTECTOMY    . ORTHOPEDIC SURGERY Left 2013   rotator cuff  . TUBAL LIGATION       OB History    Gravida  5   Para      Term      Preterm      AB      Living  4     SAB      TAB      Ectopic      Multiple      Live Births              Family History  Problem Relation Age of Onset  . Cancer Mother   . Stroke Mother   . Heart attack Mother   . Bipolar disorder Sister   . Stroke Brother   . Asthma Daughter   . Bipolar disorder Daughter   . ADD / ADHD Son   . Asperger's syndrome Son     Social History   Tobacco Use  . Smoking status: Former Games developer  . Smokeless tobacco: Never Used  Substance Use Topics  . Alcohol use: No  . Drug use: No    Home Medications Prior to Admission medications   Medication Sig Start Date End Date Taking? Authorizing Provider  albuterol (PROVENTIL) (2.5 MG/3ML) 0.083% nebulizer solution Take 3 mLs (2.5 mg total) by nebulization every 6 (six) hours as needed for wheezing or shortness of breath. 04/11/19  Yes Kallie Locks, FNP  albuterol (VENTOLIN HFA) 108 (90 Base) MCG/ACT inhaler Inhale 2 puffs into the lungs every 6 (six) hours as needed for wheezing or shortness of breath. 10/13/19  Yes  Kallie Locks, FNP  budesonide-formoterol [facility]) 80-4.5 MCG/ACT inhaler Inhale 2 puffs into the lungs 2 (two) times daily. 10/13/19  Yes Kallie Locks, FNP  cetirizine (ZYRTEC) 10 MG tablet Take 1 tablet (10 mg total) by mouth daily. Patient taking differently: Take 10 mg by mouth daily as needed for allergies.  04/11/19  Yes Kallie Locks, FNP  gemfibrozil (LOPID) 600 MG tablet Take 1 tablet (600 mg total) by mouth 2 (two) times daily before a meal. 10/13/19  Yes Kallie Locks, FNP  Vitamin D, Ergocalciferol, (DRISDOL) 1.25 MG (50000 UT) CAPS capsule Take 1 capsule (50,000 Units total) by mouth every 7 (seven) days. 10/13/19  Yes Kallie Locks, FNP  meclizine (ANTIVERT) 25 MG tablet Take 1 tablet (25 mg total) by mouth 3 (three) times daily as needed for up to 7 days for dizziness. 03/20/20 03/27/20  Claude Manges, PA-C    Allergies    Oxycodone  Review of Systems   Review of  Systems  Constitutional: Negative for fever.  Respiratory: Negative for shortness of breath.   Cardiovascular: Negative for chest pain.  Gastrointestinal: Positive for abdominal pain and nausea. Negative for diarrhea and vomiting.  Genitourinary: Negative for flank pain.  Musculoskeletal: Positive for back pain.  Skin: Negative for pallor and wound.  Neurological: Positive for dizziness and headaches.  All other systems reviewed and are negative.   Physical Exam Updated Vital Signs BP (!) 163/83   Pulse 70   Temp 98.1 F (36.7 C) (Oral)   Resp 18   Ht 5\' 3"  (1.6 m)   Wt 85.7 kg   SpO2 100%   BMI 33.48 kg/m   Physical Exam Vitals and nursing note reviewed.  Constitutional:      Appearance: She is well-developed. She is obese.  HENT:     Head: Normocephalic and atraumatic.  Eyes:     Extraocular Movements:     Right eye: Nystagmus present.     Left eye: Nystagmus present.     Comments: Peripheral nystagmus noted.   Cardiovascular:     Rate and Rhythm: Normal rate.  Pulmonary:     Effort: Pulmonary effort is normal.     Breath sounds: No wheezing or rales.  Abdominal:     General: Abdomen is flat. Bowel sounds are decreased. There is no distension.     Palpations: Abdomen is soft.     Tenderness: There is generalized abdominal tenderness and tenderness in the epigastric area. There is right CVA tenderness. There is no left CVA tenderness. Negative signs include Murphy's sign and McBurney's sign.     Hernia: No hernia is present.  Skin:    General: Skin is warm and dry.  Neurological:     Mental Status: She is alert and oriented to person, place, and time.     Comments: Alert, oriented, thought content appropriate. Speech fluent without evidence of aphasia. Able to follow 2 step commands without difficulty.  Cranial Nerves:  II:  Peripheral visual fields grossly normal, pupils, round, reactive to light III,IV, VI: ptosis not present, extra-ocular motions intact  bilaterally  V,VII: smile symmetric, facial light touch sensation equal VIII: hearing grossly normal bilaterally  IX,X: midline uvula rise  XI: bilateral shoulder shrug equal and strong XII: midline tongue extension  Motor:  5/5 in upper and lower extremities bilaterally including strong and equal grip strength and dorsiflexion/plantar flexion Sensory: light touch normal in all extremities.  Cerebellar: normal finger-to-nose with bilateral upper extremities, pronator drift negative  Gait: normal gait and balance      ED Results / Procedures / Treatments   Labs (all labs ordered are listed, but only abnormal results are displayed) Labs Reviewed  COMPREHENSIVE METABOLIC PANEL - Abnormal; Notable for the following components:      Result Value   Glucose, Bld 105 (*)    All other components within normal limits  URINALYSIS, ROUTINE W REFLEX MICROSCOPIC - Abnormal; Notable for the following components:   APPearance HAZY (*)    Ketones, ur 5 (*)    All other components within normal limits  LIPASE, BLOOD  CBC  I-STAT BETA HCG BLOOD, ED ([facility], [facility], [facility] ONLY)    EKG None  Radiology CT Head Wo Contrast  Result Date: 03/20/2020 CLINICAL DATA:  Headache for 2 weeks EXAM: CT HEAD WITHOUT CONTRAST TECHNIQUE: Contiguous axial images were obtained from the base of the skull through the vertex without intravenous contrast. COMPARISON:  None. FINDINGS: Brain: No acute infarct or hemorrhage. Lateral ventricles and midline structures are unremarkable. No acute extra-axial fluid collections. No mass effect. Vascular: There is residual intravascular contrast from previously performed CT abdomen and pelvis. No vascular abnormalities. Skull: Normal. Negative for fracture or focal lesion. Sinuses/Orbits: No acute finding. Other: None. IMPRESSION: 1. No acute intracranial process. Electronically Signed   By: Sharlet Salina M.D.   On: 03/20/2020 19:48   CT ABDOMEN PELVIS W CONTRAST  Result Date:  03/20/2020 CLINICAL DATA:  Nausea, vomiting, abdominal and back pain EXAM: CT ABDOMEN AND PELVIS WITH CONTRAST TECHNIQUE: Multidetector CT imaging of the abdomen and pelvis was performed using the standard protocol following bolus administration of intravenous contrast. CONTRAST:  OMNIPAQUE IOHEXOL 300 MG/ML  SOLN COMPARISON:  11/29/2019 FINDINGS: Lower chest: No acute abnormality. Hepatobiliary: No focal liver abnormality is seen. Status post cholecystectomy. No biliary dilatation. Pancreas: Unremarkable. No pancreatic ductal dilatation or surrounding inflammatory changes. Spleen: Normal in size without significant abnormality. Adrenals/Urinary Tract: Adrenal glands are unremarkable. Kidneys are normal, without renal calculi, solid lesion, or hydronephrosis. Bladder is unremarkable. Stomach/Bowel: Stomach is within normal limits. Incidental small diverticulum of the descending duodenum. Appendix appears normal. Descending and sigmoid diverticulosis. Adjacent wall thickening and fat stranding is improved, nearly resolved in comparison to prior examination. 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 No enlarged abdominal or pelvic lymph nodes. Reproductive: Fluid in the endometrial cavity. Other: No abdominal wall hernia or abnormality. No abdominopelvic ascites. Musculoskeletal: No acute or significant osseous findings. IMPRESSION: 1. There is descending and sigmoid diverticulosis. Wall thickening and fat stranding seen on prior examination dated 11/29/2019 is improved, nearly resolved. There may be some degree of persistent or recurrent diverticulitis. No evidence of perforation or abscess. 2. 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 3. Fluid in the endometrial cavity, potentially abnormal if the patient is postmenopausal. Consider pelvic ultrasound to further evaluate. Electronically Signed   By: Lauralyn Primes M.D.   On: 03/20/2020 17:46    Procedures Procedures (including critical care time)  Medications Ordered in ED Medications  sodium chloride 0.9 % bolus 1,000 mL (1,000 mLs Intravenous New Bag/Given (Non-Interop) 03/20/20 1701)  ondansetron (ZOFRAN) injection 4  mg (4 mg Intravenous Given 03/20/20 1701)  iohexol (OMNIPAQUE) 300 MG/ML solution 100 mL (100 mLs Intravenous Contrast Given 03/20/20 1719)  ketorolac (TORADOL) 15 MG/ML injection 15 mg (15 mg Intravenous Given 03/20/20 1835)  meclizine (ANTIVERT) tablet 25 mg (25 mg Oral Given 03/20/20 1834)    ED Course  I have reviewed the triage vital signs and the nursing notes.  Pertinent labs & imaging results that were available during my care of the patient were reviewed by me and considered in my medical decision making (see chart for details).    MDM Rules/Calculators/A&P  Patient with a past medical history of diverticulitis presents to the ED with complaints of lower abdominal pain with radiation to the epigastric region for the past several months worsening this past week.  She also endorses nausea, has been taking Tylenol for pain without improvement in her symptoms.  She also reports a headache, dizziness, is attributing the symptoms to work as she is currently lifting and picking up heavy boxes while at work.  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  She does endorse pain along the thoracic spine, this is worse with movement of her extremities, states "may be a strain ".  She arrived in the ED with stable vital signs, no hypoxia, tachycardia, she is afebrile.  Has not had any vomiting or diarrhea.  During my evaluation patient is overall  well-appearing, nontoxic, vitals are within normal limits.  There is tenderness to palpation to the generalized abdomen with focal pain along the epigastric region, no prior history of reflux, she does have a prior history of a cholecystectomy.  She does have some right CVA tenderness although denies urinary symptoms of today's visit.  She was given Zofran, IV fluids for symptomatic treatment.  Interpretation of her labs by me with a CBC without any leukocytosis, no signs of anemia.  CMP without any electrolyte normality, creatinine level is within normal limits.  LFTs are unremarkable.  hCG is negative, lipase level is within normal limits.  Her UA does show a hazy appearance but no nitrites, leukocytes or signs of infection.  Last bowel movement was yesterday, within normal limits, no blood in her stool.  Some suspicion for recurrent diverticulitis.  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  Will obtain repeat to further evaluate condition.  CT Abdomen pelvis:  1. There is descending and sigmoid diverticulosis. Wall thickening  and fat stranding seen on prior examination dated 11/29/2019 is  improved, nearly resolved. There may be some degree of persistent or  recurrent diverticulitis. No evidence of perforation or abscess.    2. Unchanged partially calcified 1.3 cm aneurysm of the distal right  renal artery or a branch vessel. Unchanged 1.6 cm calcified aneurysm  of the splenic artery or a branch vessel in the splenic hilum.    3. Fluid in the endometrial cavity, potentially abnormal if the  patient is postmenopausal. Consider pelvic ultrasound to further  evaluate.     She is currently having menstrual cycles, LMP on 03/05/2020-03/11/2020.  On reevaluation patient reports she continues to feel dizzy, noticeable peripheral nystagmus on my exam, neuro exam is unremarkable.  Will provide patient  with meclizine along with CT head to further evaluate.  CT head is unremarkable today, given meclizine to help with what I suspect is likely vertigo.  Reports resolution of symptoms at this time.  Patient is overall well-appearing, return precautions were discussed at length.  Encouraged to follow-up with PCP pending her aneurysm of the renal artery.  Patient understands and agrees with management.    Portions of this note were generated with Lobby[employer]. Dictation errors may occur despite best attempts at proofreading.  Final Clinical Impression(s) / ED Diagnoses Final diagnoses:  Lower abdominal pain  Strain of lumbar region, initial encounter  Vertigo    Rx / DC Orders ED Discharge Orders         Ordered    meclizine (ANTIVERT) 25 MG tablet  3 times daily PRN     03/20/20 2015           Janeece Fitting, PA-C 03/20/20 2016    Daleen Bo, MD 03/21/20 1239

## 2020-03-20 NOTE — Discharge Instructions (Signed)
A copy of your CT results were given to you on today's visit.  We discussed the urgency of following up with your primary care physician for following up on your condition.  I have also provided a prescription for medication to help with your dizziness, please take 1 tablet up to 3 times a day for the next 7 days.  Please return to the emergency department if you experience any worsening symptoms.

## 2020-03-22 MED FILL — MECLIZINE 25 MG TABLET: 25 | 7 days supply | Qty: 21 | Fill #0

## 2020-03-24 MED FILL — SYMBICORT 80-4.5 MCG INH: 80-4.5 | 30 days supply | Qty: 10 | Fill #1

## 2020-03-24 MED FILL — GEMFIBROZIL 600 MG TAB: 600 | 30 days supply | Qty: 60 | Fill #1

## 2020-03-24 MED FILL — VIT D2 1.25 MG (50,000 UNIT: 1.25 MG | 35 days supply | Qty: 5 | Fill #2

## 2020-03-24 MED FILL — VENTOLIN HFA 90 MCG INHALER: 108 (90 BAS | 25 days supply | Qty: 18 | Fill #1

## 2020-04-12 ENCOUNTER — Ambulatory Visit: Payer: Self-pay | Admitting: Family Medicine

## 2020-04-28 ENCOUNTER — Ambulatory Visit
Admission: EM | Admit: 2020-04-28 | Discharge: 2020-04-28 | Disposition: A | Discharge status: Home / Self Care | Payer: Medicaid Other | Attending: Physician Assistant | Admitting: Physician Assistant

## 2020-04-28 ENCOUNTER — Other Ambulatory Visit: Payer: Self-pay

## 2020-04-28 DIAGNOSIS — L02211 Cutaneous abscess of abdominal wall: Secondary | ICD-10-CM

## 2020-04-28 LAB — POCT FASTING CBG KUC MANUAL ENTRY: POCT Glucose (KUC): 108 mg/dL — AB (ref 70–99)

## 2020-04-28 MED ORDER — DOXYCYCLINE HYCLATE 100 MG PO TABS: 100.0000 mg | ORAL_TABLET | Freq: Two times a day (BID) | ORAL | 0 refills | Status: DC

## 2020-04-28 NOTE — Discharge Instructions (Signed)
Return if any problems. Soak area 20 minutes every hour today then 20 minutes 4 times a day tommorow

## 2020-04-28 NOTE — ED Triage Notes (Signed)
Pt c/o ?bug bite to lt lower abdomen 3-4 days ago. Now having redness and drainage to that area.

## 2020-04-30 NOTE — ED Provider Notes (Signed)
[facility]    CSN: [id number] Arrival date & time: 04/28/20  1152      History   Chief Complaint Chief Complaint  Patient presents with  . Wound Check    HPI Kathy Hamilton is a 54 y.o. female.   The history is provided by the patient. No language interpreter was used.  Wound Check This is a new problem. The current episode started more than 2 days ago. The problem occurs constantly. The problem has been gradually worsening. Nothing aggravates the symptoms. Nothing relieves the symptoms. She has tried nothing for the symptoms. The treatment provided no relief.  Pt reports she has an infected area on her lower abdomen   Past Medical History:  Diagnosis Date  . Bronchitis   . Community acquired pneumonia   . H/O tubal ligation 09/2019  . Hypertriglyceridemia   . Premenopausal patient   . Seasonal allergies   . Shortness of breath     Patient Active Problem List   Diagnosis Date Noted  . Skin rash 10/14/2019  . Skin infection 10/14/2019  . Irregular periods 04/11/2019  . Premenopausal patient 04/11/2019  . Dyspnea on exertion 04/11/2019  . Seasonal allergies 04/11/2019  . Vitamin D deficiency 04/11/2019  . Family history of diabetes mellitus 03/27/2016  . Family history of hypercholesterolemia 03/27/2016    Past Surgical History:  Procedure Laterality Date  . CHOLECYSTECTOMY    . ORTHOPEDIC SURGERY Left 2013   rotator cuff  . TUBAL LIGATION      OB History    Gravida  5   Para      Term      Preterm      AB      Living  4     SAB      TAB      Ectopic      Multiple      Live Births               Home Medications    Prior to Admission medications   Medication Sig Start Date End Date Taking? Authorizing Provider  albuterol (PROVENTIL) (2.5 MG/3ML) 0.083% nebulizer solution Take 3 mLs (2.5 mg total) by nebulization every 6 (six) hours as needed for wheezing or shortness of breath. 04/11/19   Kallie Locks, FNP  albuterol  (VENTOLIN HFA) 108 (90 Base) MCG/ACT inhaler Inhale 2 puffs into the lungs every 6 (six) hours as needed for wheezing or shortness of breath. 10/13/19   Kallie Locks, FNP  budesonide-formoterol (SYMBICORT) 80-4.5 MCG/ACT inhaler Inhale 2 puffs into the lungs 2 (two) times daily. 10/13/19   Kallie Locks, FNP  cetirizine (ZYRTEC) 10 MG tablet Take 1 tablet (10 mg total) by mouth daily. Patient taking differently: Take 10 mg by mouth daily as needed for allergies.  04/11/19   Kallie Locks, FNP  doxycycline (VIBRA-TABS) 100 MG tablet Take 1 tablet (100 mg total) by mouth 2 (two) times daily. 04/28/20   Elson Areas, PA-C  gemfibrozil (LOPID) 600 MG tablet Take 1 tablet (600 mg total) by mouth 2 (two) times daily before a meal. 10/13/19   Kallie Locks, FNP  Vitamin D, Ergocalciferol, (DRISDOL) 1.25 MG (50000 UT) CAPS capsule Take 1 capsule (50,000 Units total) by mouth every 7 (seven) days. 10/13/19   Kallie Locks, FNP    Family History Family History  Problem Relation Age of Onset  . Cancer Mother   . Stroke Mother   .  Heart attack Mother   . Bipolar disorder Sister   . Stroke Brother   . Asthma Daughter   . Bipolar disorder Daughter   . ADD / ADHD Son   . Asperger's syndrome Son     Social History Social History   Tobacco Use  . Smoking status: Former Games developer  . Smokeless tobacco: Never Used  Substance Use Topics  . Alcohol use: No  . Drug use: No     Allergies   Oxycodone   Review of Systems Review of Systems  All other systems reviewed and are negative.    Physical Exam Triage Vital Signs ED Triage Vitals  Enc Vitals Group     BP 04/28/20 1227 (!) 177/95     Pulse Rate 04/28/20 1227 81     Resp 04/28/20 1227 16     Temp 04/28/20 1227 98.2 F (36.8 C)     Temp Source 04/28/20 1227 Oral     SpO2 04/28/20 1227 96 %     Weight --      Height --      Head Circumference --      Peak Flow --      Pain Score 04/28/20 1242 7     Pain Loc --        Pain Edu? --      Excl. in GC? --    No data found.  Updated Vital Signs BP (!) 177/95 (BP Location: Right Arm)   Pulse 81   Temp 98.2 F (36.8 C) (Oral)   Resp 16   LMP 04/07/2020   SpO2 96%   Visual Acuity Right Eye Distance:   Left Eye Distance:   Bilateral Distance:    Right Eye Near:   Left Eye Near:    Bilateral Near:     Physical Exam Vitals and nursing note reviewed.  Constitutional:      Appearance: She is well-developed.  HENT:     Head: Normocephalic.  Cardiovascular:     Rate and Rhythm: Normal rate.  Pulmonary:     Effort: Pulmonary effort is normal.  Abdominal:     General: There is no distension.     Comments: 1cm red swollen area,    Musculoskeletal:        General: Normal range of motion.     Cervical back: Normal range of motion.  Skin:    Findings: Erythema present.  Neurological:     Mental Status: She is alert and oriented to person, place, and time.  Psychiatric:        Mood and Affect: Mood normal.      UC Treatments / Results  Labs (all labs ordered are listed, but only abnormal results are displayed) Labs Reviewed  POCT FASTING CBG [facility] MANUAL ENTRY - Abnormal; Notable for the following components:      Result Value   POCT Glucose ([facility]) 108 (*)    All other components within normal limits    EKG   Radiology No results found.  Procedures Procedures (including critical care time)  Medications Ordered in UC Medications - No data to display  Initial Impression / Assessment and Plan / UC Course  I have reviewed the triage vital signs and the nursing notes.  Pertinent labs & imaging results that were available during my care of the patient were reviewed by me and considered in my medical decision making (see chart for details).     MDM:  Small area of skin infection. Possible early  abscess.  Pt advised warm compresses and antibiotics Final Clinical Impressions(s) / UC Diagnoses   Final diagnoses:  Abscess of skin  of abdomen     Discharge Instructions     Return if any problems. Soak area 20 minutes every hour today then 20 minutes 4 times a day tommorow   ED Prescriptions    Medication Sig Dispense Auth. Provider   doxycycline (VIBRA-TABS) 100 MG tablet Take 1 tablet (100 mg total) by mouth 2 (two) times daily. 20 tablet Fransico Meadow, [place]     An After Visit Summary was printed and given to the patient.  PDMP not reviewed this encounter.   Fransico Meadow, [place] 04/30/20 1414

## 2020-05-12 ENCOUNTER — Ambulatory Visit: Payer: Medicaid Other | Attending: Family

## 2020-05-12 DIAGNOSIS — Z23 Encounter for immunization: Secondary | ICD-10-CM

## 2020-05-12 NOTE — Progress Notes (Signed)
   Covid-19 Vaccination Clinic  Name:  JOANIE DUPREY    MRN: [id number] DOB: [birth year]  05/12/2020  Ms. Manternach was observed post Covid-19 immunization for 15 minutes without incident. She was provided with Vaccine Information Sheet and instruction to access the V-Safe system.   Ms. Grupe was instructed to call 911 with any severe reactions post vaccine: Marland Kitchen Difficulty breathing  . Swelling of face and throat  . A fast heartbeat  . A bad rash all over body  . Dizziness and weakness   Immunizations Administered    Name Date Dose VIS Date Route   Pfizer COVID-19 Vaccine 05/12/2020  1:39 PM 0.3 mL 01/21/2019 Intramuscular   Manufacturer: [employer], [employer]   Lot: [id number]   NDC: [address]-7

## 2020-06-09 ENCOUNTER — Ambulatory Visit: Payer: Medicaid Other | Attending: Internal Medicine

## 2020-06-21 IMAGING — CT CT ABD-PELV W/ CM
2 of 5 series · 16 of 46 positions shown, 18 images · IV contrast (omnipaque)
Comparison: 11/29/2019

CLINICAL DATA: Nausea, vomiting, abdominal and back pain

EXAM:
CT ABDOMEN AND PELVIS WITH CONTRAST
TECHNIQUE: Multidetector CT imaging of the abdomen and pelvis was performed
using the standard protocol following bolus administration of
intravenous contrast.
CONTRAST:  100mL OMNIPAQUE IOHEXOL 300 MG/ML  SOLN

[Series 2: axial st · axial · 0.78mm/px · z∈[-593,-213]mm · 13 of 90 slices shown, 15 images]
[im 7/90  soft-tissue]
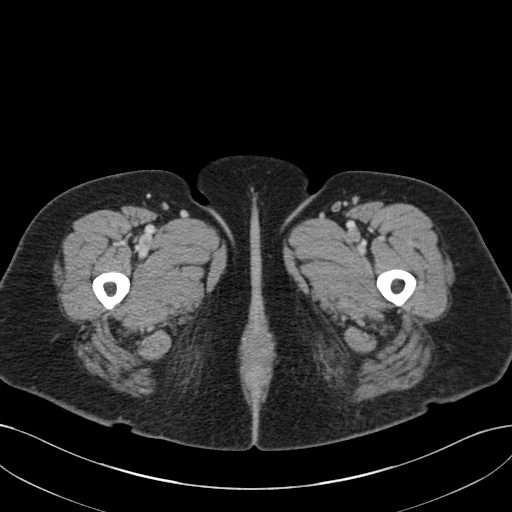
[im 7/90  bone]
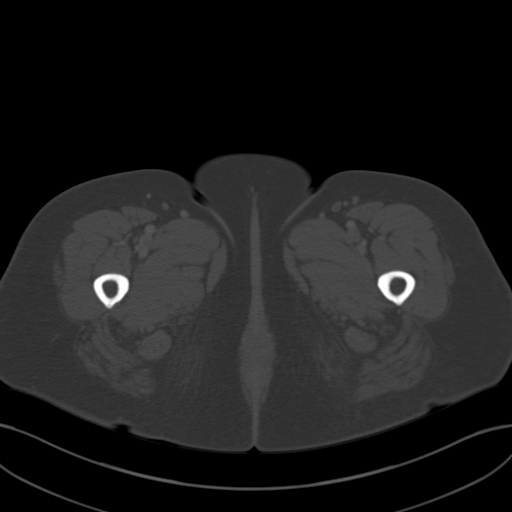
[im 13/90  soft-tissue]
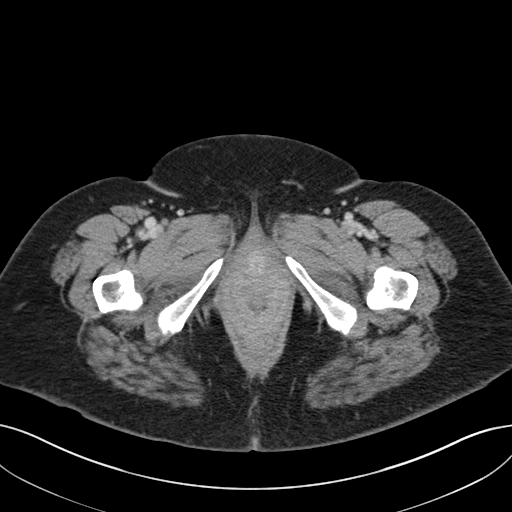
[im 20/90  soft-tissue]
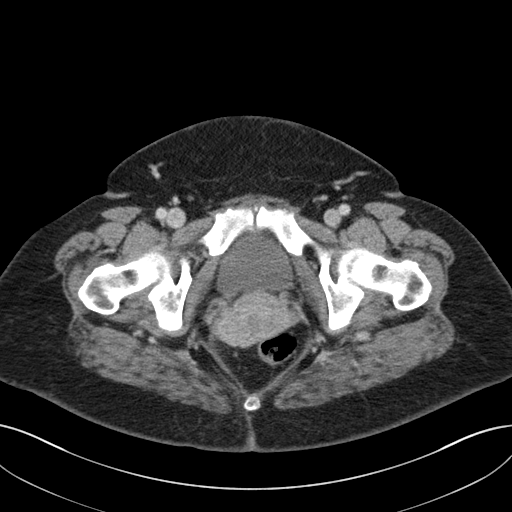
[im 26/90  soft-tissue]
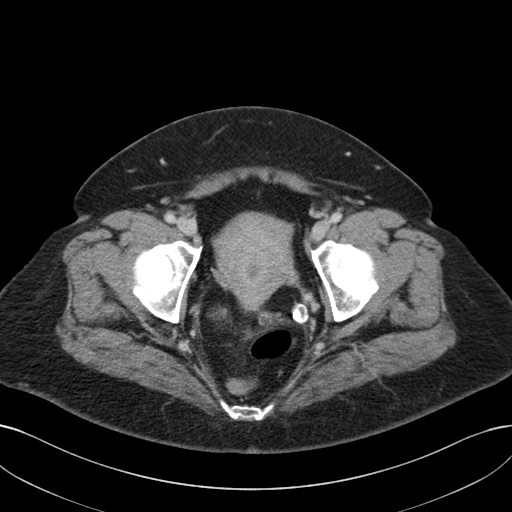
[im 32/90  soft-tissue]
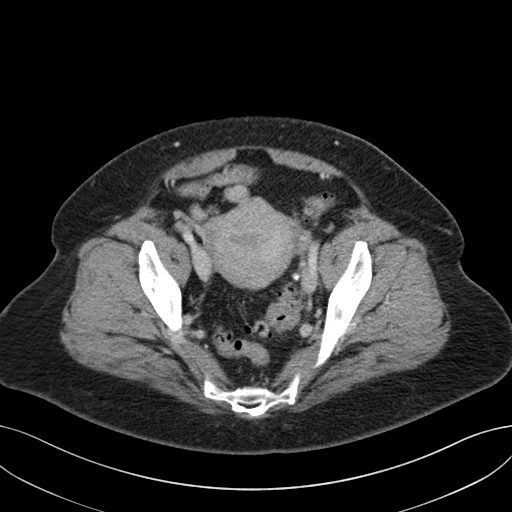
[im 39/90  soft-tissue]
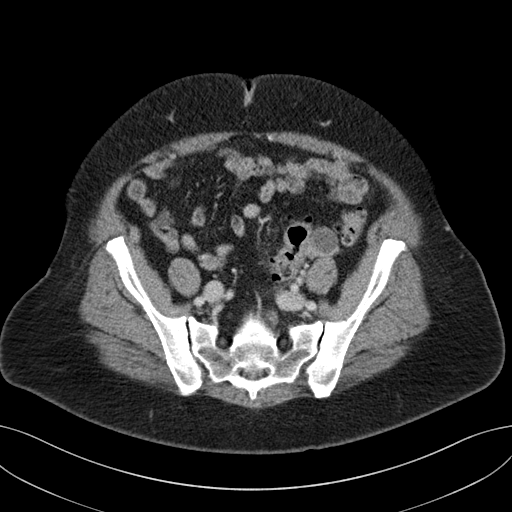
[im 45/90  soft-tissue]
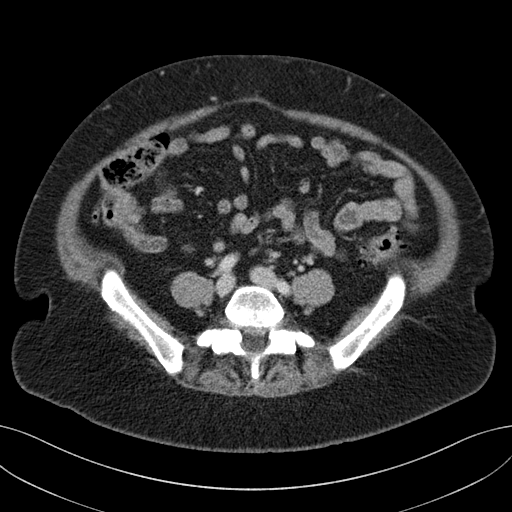
[im 51/90  soft-tissue]
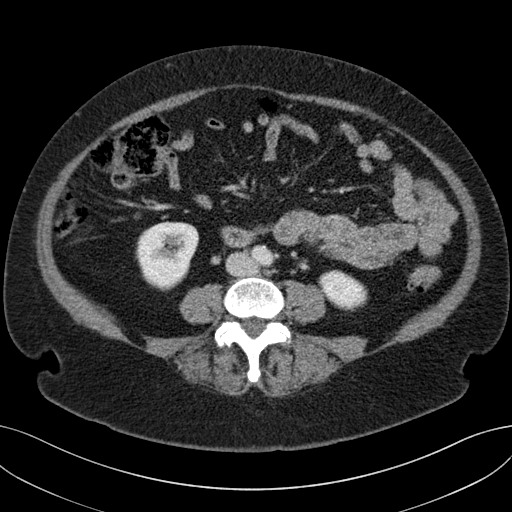
[im 58/90  soft-tissue]
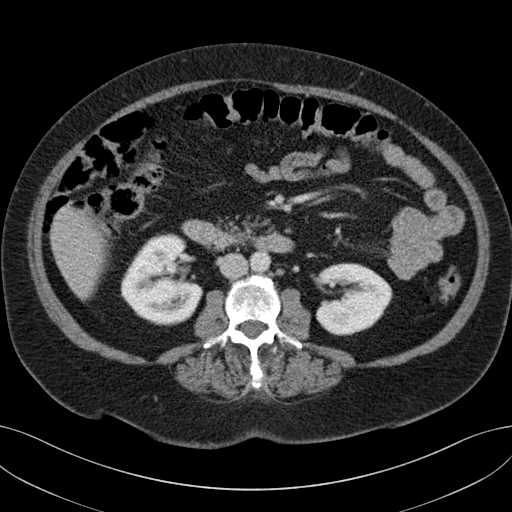
[im 58/90  bone]
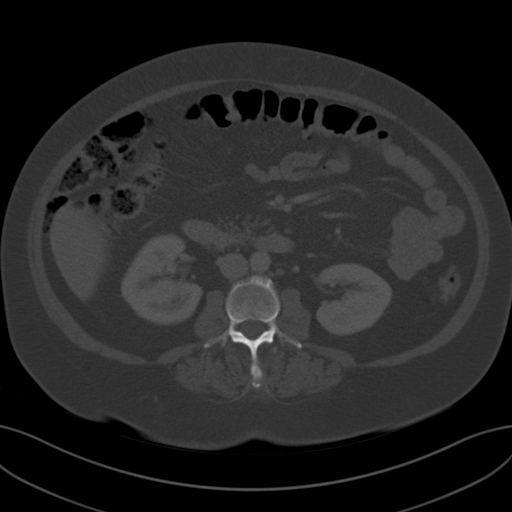
[im 64/90  soft-tissue]
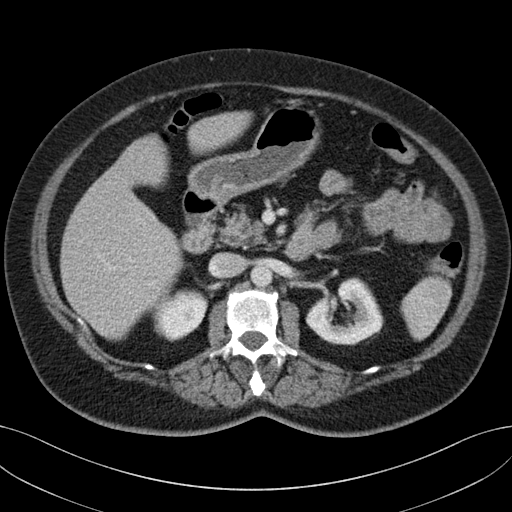
[im 70/90  soft-tissue]
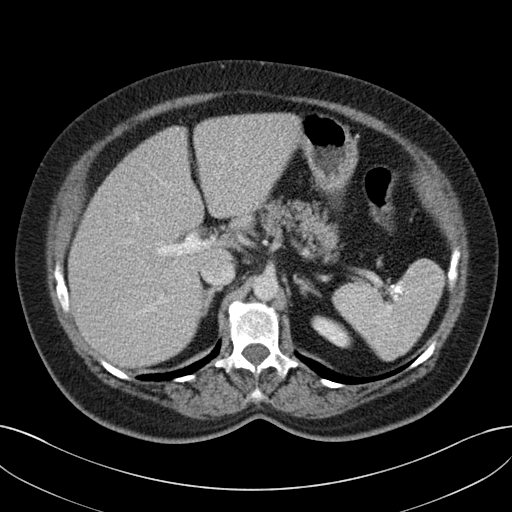
[im 77/90  soft-tissue]
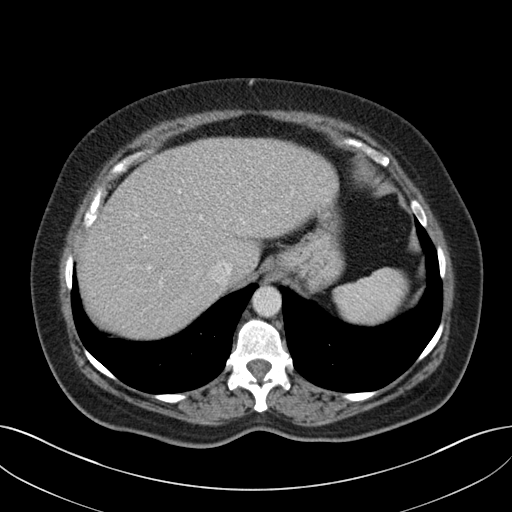
[im 83/90  soft-tissue]
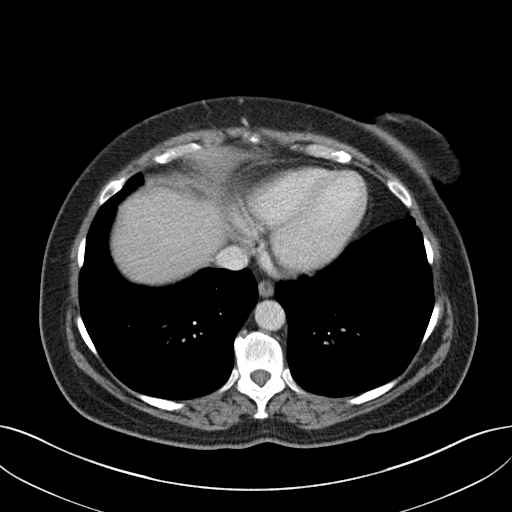

[Series 4: coronal st · coronal · 0.90mm/px · 3 of 151 slices shown]
[im 51/151  soft-tissue]
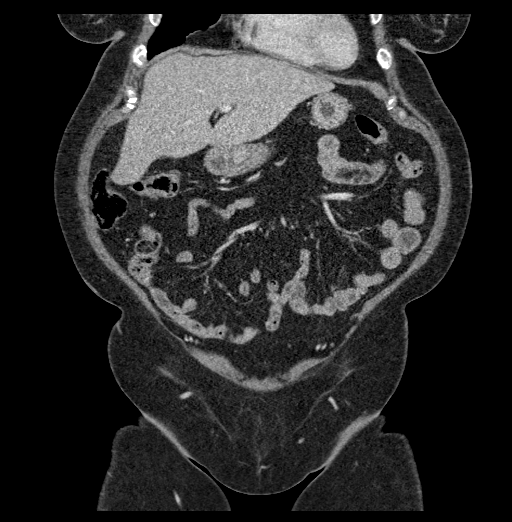
[im 67/151  soft-tissue]
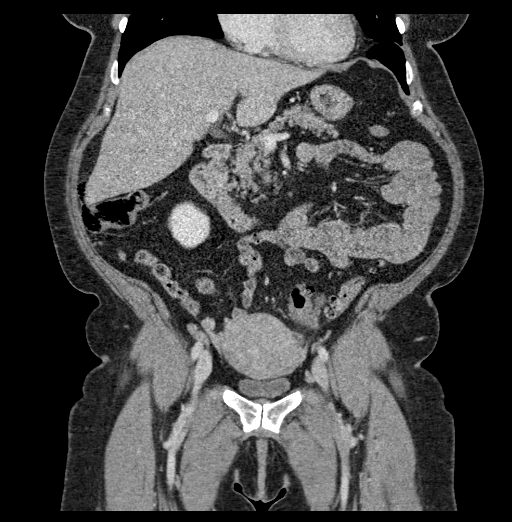
[im 84/151  soft-tissue]
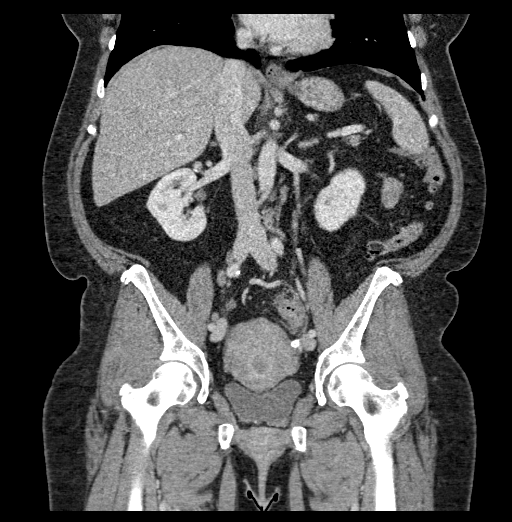

[16 of 46 positions shown; findings below may reference images not displayed]

FINDINGS: Lower chest: No acute abnormality.

Hepatobiliary: No focal liver abnormality is seen. Status post
cholecystectomy. No biliary dilatation.

Pancreas: Unremarkable. No pancreatic ductal dilatation or
surrounding inflammatory changes.

Spleen: Normal in size without significant abnormality.

Adrenals/Urinary Tract: Adrenal glands are unremarkable. Kidneys are
normal, without renal calculi, solid lesion, or hydronephrosis.
Bladder is unremarkable.

Stomach/Bowel: Stomach is within normal limits. Incidental small
diverticulum of the descending duodenum. Appendix appears normal.
Descending and sigmoid diverticulosis. Adjacent wall thickening and
fat stranding is improved, nearly resolved in comparison to prior
examination.

Vascular/Lymphatic: Unchanged partially calcified 1.3 cm aneurysm of
the distal right renal artery or a branch vessel (series 2, image
29). Unchanged 1.6 cm calcified aneurysm of the splenic artery or a
branch vessel in the splenic hilum (series 2, image 22). No enlarged
abdominal or pelvic lymph nodes.

Reproductive: Fluid in the endometrial cavity.

Other: No abdominal wall hernia or abnormality. No abdominopelvic
ascites.

Musculoskeletal: No acute or significant osseous findings.
IMPRESSION: 1. There is descending and sigmoid diverticulosis. Wall thickening
and fat stranding seen on prior examination dated 11/29/2019 is
improved, nearly resolved. There may be some degree of persistent or
recurrent diverticulitis. No evidence of perforation or abscess.

2. Unchanged partially calcified 1.3 cm aneurysm of the distal right
renal artery or a branch vessel. Unchanged 1.6 cm calcified aneurysm
of the splenic artery or a branch vessel in the splenic hilum.

3. Fluid in the endometrial cavity, potentially abnormal if the
patient is postmenopausal. Consider pelvic ultrasound to further
evaluate.

## 2020-08-03 ENCOUNTER — Ambulatory Visit (INDEPENDENT_AMBULATORY_CARE_PROVIDER_SITE_OTHER): Payer: Self-pay | Admitting: Family Medicine

## 2020-08-03 ENCOUNTER — Encounter: Payer: Self-pay | Admitting: Family Medicine

## 2020-08-03 ENCOUNTER — Other Ambulatory Visit: Payer: Self-pay

## 2020-08-03 VITALS — BP 146/78 | HR 76 | Temp 98.1°F | Resp 17 | Ht 63.0 in | Wt 188.4 lb

## 2020-08-03 DIAGNOSIS — Z1211 Encounter for screening for malignant neoplasm of colon: Secondary | ICD-10-CM

## 2020-08-03 DIAGNOSIS — J302 Other seasonal allergic rhinitis: Secondary | ICD-10-CM

## 2020-08-03 DIAGNOSIS — N959 Unspecified menopausal and perimenopausal disorder: Secondary | ICD-10-CM

## 2020-08-03 DIAGNOSIS — R0602 Shortness of breath: Secondary | ICD-10-CM

## 2020-08-03 DIAGNOSIS — Z809 Family history of malignant neoplasm, unspecified: Secondary | ICD-10-CM

## 2020-08-03 DIAGNOSIS — Z0000 Encounter for general adult medical examination without abnormal findings: Secondary | ICD-10-CM

## 2020-08-03 DIAGNOSIS — R1084 Generalized abdominal pain: Secondary | ICD-10-CM

## 2020-08-03 DIAGNOSIS — Z1239 Encounter for other screening for malignant neoplasm of breast: Secondary | ICD-10-CM

## 2020-08-03 DIAGNOSIS — Z3200 Encounter for pregnancy test, result unknown: Secondary | ICD-10-CM

## 2020-08-03 DIAGNOSIS — N926 Irregular menstruation, unspecified: Secondary | ICD-10-CM

## 2020-08-03 DIAGNOSIS — Z09 Encounter for follow-up examination after completed treatment for conditions other than malignant neoplasm: Secondary | ICD-10-CM

## 2020-08-03 DIAGNOSIS — Z803 Family history of malignant neoplasm of breast: Secondary | ICD-10-CM

## 2020-08-03 DIAGNOSIS — E781 Pure hyperglyceridemia: Secondary | ICD-10-CM

## 2020-08-03 LAB — POCT GLYCOSYLATED HEMOGLOBIN (HGB A1C)
HbA1c POC (<> result, manual entry): 5.5 % (ref 4.0–5.6)
HbA1c, POC (controlled diabetic range): 5.5 % (ref 0.0–7.0)
HbA1c, POC (prediabetic range): 5.5 % — AB (ref 5.7–6.4)
Hemoglobin A1C: 5.5 % (ref 4.0–5.6)

## 2020-08-03 LAB — POCT URINALYSIS DIPSTICK
Bilirubin, UA: NEGATIVE
Blood, UA: NEGATIVE
Glucose, UA: NEGATIVE
Ketones, UA: NEGATIVE
Leukocytes, UA: NEGATIVE
Nitrite, UA: NEGATIVE
Protein, UA: NEGATIVE
Spec Grav, UA: 1.025 (ref 1.010–1.025)
Urobilinogen, UA: 0.2 E.U./dL
pH, UA: 5.5 (ref 5.0–8.0)

## 2020-08-03 LAB — GLUCOSE, POCT (MANUAL RESULT ENTRY): POC Glucose: 109 mg/dl — AB (ref 70–99)

## 2020-08-03 NOTE — Progress Notes (Signed)
[facility]   Annual Physical  Subjective:  Patient ID: Kathy Hamilton, female    DOB: October 31, 1966  Age: 54 y.o. MRN: [id number]  CC:  Chief Complaint  Patient presents with  . Annual Exam    Pt also states she thinks she is pregnant in her tudes. Pt states she is regular on her cycle but on 7/31/221 her mate had intercourse and her cycle has not came on X47months.     HPI Kathy Hamilton is a 54 year old female who presents for her Annual Physical today.    Patient Active Problem List   Diagnosis Date Noted  . Skin rash 10/14/2019  . Skin infection 10/14/2019  . Irregular periods 04/11/2019  . Premenopausal patient 04/11/2019  . Dyspnea on exertion 04/11/2019  . Seasonal allergies 04/11/2019  . Vitamin D deficiency 04/11/2019  . Family history of diabetes mellitus 03/27/2016  . Family history of hypercholesterolemia 03/27/2016     Current Status: Since her last office visit, she has had multiple ED and and Urgent Care visits for Diverticulitis and other issues. She states that she has had these visits r/t [employer]. Today, she  is doing well with no complaints. Denies GI problems such as nausea, vomiting, diarrhea, and constipation. She has no reports of blood in stools, dysuria and hematuria. Her anxiety is stable today, as she suspects that she may be pregnant. She reports that she has not had a menstrual period in 2 months. She also reports generalized abdominal pain X a few weeks now. She denies suicidal ideations, homicidal ideations, or auditory hallucinations. She denies fevers, chills, fatigue, recent infections, weight loss, and night sweats. She has not had any headaches, visual changes, dizziness, and falls. No chest pain, heart palpitations, cough and shortness of breath reported. She is taking all medications as prescribed.   Past Medical History:  Diagnosis Date  . Bronchitis   . Community acquired pneumonia   . H/O  tubal ligation 09/2019  . Hypertriglyceridemia   . Premenopausal patient   . Seasonal allergies   . Shortness of breath     Past Surgical History:  Procedure Laterality Date  . CHOLECYSTECTOMY    . ORTHOPEDIC SURGERY Left 2013   rotator cuff  . TUBAL LIGATION      Family History  Problem Relation Age of Onset  . Cancer Mother   . Stroke Mother   . Heart attack Mother   . Colon cancer Mother   . Bipolar disorder Sister   . Breast cancer Sister   . Cervical cancer Sister   . Stroke Brother   . Asthma Daughter   . Bipolar disorder Daughter   . ADD / ADHD Son   . Asperger's syndrome Son     Social History   Socioeconomic History  . Marital status: Widowed    Spouse name: Not on file  . Number of children: Not on file  . Years of education: Not on file  . Highest education level: Not on file  Occupational History  . Not on file  Tobacco Use  . Smoking status: Former Games developer  . Smokeless tobacco: Never Used  Vaping Use  . Vaping Use: Never used  Substance and Sexual Activity  . Alcohol use: No  . Drug use: No  . Sexual activity: Yes  Other Topics Concern  . Not on file  Social History Narrative  . Not on file   Social Determinants of  Health   Financial Resource Strain:   . Difficulty of Paying Living Expenses: Not on file  Food Insecurity:   . Worried About Programme researcher, broadcasting/film/video in the Last Year: Not on file  . Ran Out of Food in the Last Year: Not on file  Transportation Needs:   . Lack of Transportation (Medical): Not on file  . Lack of Transportation (Non-Medical): Not on file  Physical Activity:   . Days of Exercise per Week: Not on file  . Minutes of Exercise per Session: Not on file  Stress:   . Feeling of Stress : Not on file  Social Connections:   . Frequency of Communication with Friends and Family: Not on file  . Frequency of Social Gatherings with Friends and Family: Not on file  . Attends Religious Services: Not on file  . Active Member of  Clubs or Organizations: Not on file  . Attends Banker Meetings: Not on file  . Marital Status: Not on file  Intimate Partner Violence:   . Fear of Current or Ex-Partner: Not on file  . Emotionally Abused: Not on file  . Physically Abused: Not on file  . Sexually Abused: Not on file    Outpatient Medications Prior to Visit  Medication Sig Dispense Refill  . albuterol (PROVENTIL) (2.5 MG/3ML) 0.083% nebulizer solution Take 3 mLs (2.5 mg total) by nebulization every 6 (six) hours as needed for wheezing or shortness of breath. 75 mL 6  . albuterol (VENTOLIN HFA) 108 (90 Base) MCG/ACT inhaler Inhale 2 puffs into the lungs every 6 (six) hours as needed for wheezing or shortness of breath. 8 g 12  . budesonide-formoterol (SYMBICORT) 80-4.5 MCG/ACT inhaler Inhale 2 puffs into the lungs 2 (two) times daily. 1 Inhaler 6  . gemfibrozil (LOPID) 600 MG tablet Take 1 tablet (600 mg total) by mouth 2 (two) times daily before a meal. 60 tablet 6  . Vitamin D, Ergocalciferol, (DRISDOL) 1.25 MG (50000 UT) CAPS capsule Take 1 capsule (50,000 Units total) by mouth every 7 (seven) days. (Patient not taking: Reported on 08/03/2020) 5 capsule 6  . cetirizine (ZYRTEC) 10 MG tablet Take 1 tablet (10 mg total) by mouth daily. (Patient not taking: Reported on 08/03/2020) 30 tablet 11  . doxycycline (VIBRA-TABS) 100 MG tablet Take 1 tablet (100 mg total) by mouth 2 (two) times daily. (Patient not taking: Reported on 08/03/2020) 20 tablet 0   No facility-administered medications prior to visit.    Allergies  Allergen Reactions  . Oxycodone     Unknown reaction     ROS Review of Systems  Constitutional: Negative.   HENT: Negative.   Eyes: Negative.   Respiratory: Negative.   Cardiovascular: Negative.   Gastrointestinal: Positive for abdominal distention.  Endocrine: Negative.   Genitourinary: Positive for menstrual problem (premenopausal).  Musculoskeletal: Positive for arthralgias (generalized  joint pain).       Generalized abdominal pain  Skin: Negative.   Allergic/Immunologic: Negative.   Neurological: Positive for dizziness (occasional ) and headaches (occasional).  Hematological: Negative.   Psychiatric/Behavioral: Negative.       Objective:    Physical Exam Vitals and nursing note reviewed.  Constitutional:      Appearance: Normal appearance.  HENT:     Head: Normocephalic and atraumatic.     Nose: Nose normal.     Mouth/Throat:     Mouth: Mucous membranes are moist.     Pharynx: Oropharynx is clear.  Cardiovascular:  Rate and Rhythm: Normal rate and regular rhythm.     Pulses: Normal pulses.     Heart sounds: Normal heart sounds.  Pulmonary:     Effort: Pulmonary effort is normal.     Breath sounds: Normal breath sounds.  Abdominal:     General: Bowel sounds are normal. There is distension (obese).     Palpations: Abdomen is soft.  Musculoskeletal:        General: Normal range of motion.     Cervical back: Normal range of motion and neck supple.  Skin:    General: Skin is warm and dry.  Neurological:     General: No focal deficit present.     Mental Status: She is alert and oriented to person, place, and time.  Psychiatric:        Mood and Affect: Mood normal.        Behavior: Behavior normal.        Thought Content: Thought content normal.        Judgment: Judgment normal.     There were no vitals taken for this visit. Wt Readings from Last 3 Encounters:  03/20/20 189 lb (85.7 kg)  10/13/19 188 lb (85.3 kg)  04/11/19 187 lb (84.8 kg)     Health Maintenance Due  Topic Date Due  . Hepatitis C Screening  Never done  . HIV Screening  Never done  . COLONOSCOPY  Never done  . PAP SMEAR-Modifier  12/25/2016  . MAMMOGRAM  05/01/2018  . COVID-19 Vaccine (2 - Pfizer 2-dose series) 06/02/2020  . INFLUENZA VACCINE  Never done    There are no preventive care reminders to display for this patient.  Lab Results  Component Value Date   TSH  1.640 04/11/2019   Lab Results  Component Value Date   WBC 10.0 03/20/2020   HGB 13.5 03/20/2020   HCT 42.8 03/20/2020   MCV 93.7 03/20/2020   PLT 373 03/20/2020   Lab Results  Component Value Date   NA 140 03/20/2020   K 3.9 03/20/2020   CO2 27 03/20/2020   GLUCOSE 105 (H) 03/20/2020   BUN 10 03/20/2020   CREATININE 0.61 03/20/2020   BILITOT 0.4 03/20/2020   ALKPHOS 62 03/20/2020   AST 21 03/20/2020   ALT 20 03/20/2020   PROT 7.5 03/20/2020   ALBUMIN 4.2 03/20/2020   CALCIUM 9.0 03/20/2020   ANIONGAP 8 03/20/2020   Lab Results  Component Value Date   CHOL 179 04/11/2019   Lab Results  Component Value Date   HDL 47 04/11/2019   Lab Results  Component Value Date   LDLCALC 87 04/11/2019   Lab Results  Component Value Date   TRIG 227 (H) 04/11/2019   Lab Results  Component Value Date   CHOLHDL 3.8 04/11/2019   Lab Results  Component Value Date   HGBA1C 5.5 08/03/2020   HGBA1C 5.5 08/03/2020   HGBA1C 5.5 (A) 08/03/2020   HGBA1C 5.5 08/03/2020      Assessment & Plan:   1. Annual physical exam Moderate abdominal pain today, otherwise normal exam.  Follow-up for scheduled mammogram, Cologuard, and Pap Smear today.  Recommend monthly self breast exam Recommend daily multivitamin for women Recommend strength training in 150 minutes of cardiovascular exercise per week  2. Hypertriglyceridemia - CBC with Differential - Comprehensive metabolic panel - Lipid Panel - TSH - Vitamin B12 - Vitamin D, 25-hydroxy - MM DIGITAL SCREENING BILATERAL; Future - gemfibrozil (LOPID) 600 MG tablet; Take 1 tablet (600  mg total) by mouth 2 (two) times daily before a meal.  Dispense: 60 tablet; Refill: 11  3. Shortness of breath Stable today. No signs or symptoms of respiratory distress noted or reported today. Monitor.  - albuterol (PROVENTIL) (2.5 MG/3ML) 0.083% nebulizer solution; Take 3 mLs (2.5 mg total) by nebulization every 6 (six) hours as needed for wheezing or  shortness of breath.  Dispense: 75 mL; Refill: 11 - albuterol (VENTOLIN HFA) 108 (90 Base) MCG/ACT inhaler; Inhale 2 puffs into the lungs every 6 (six) hours as needed for wheezing or shortness of breath.  Dispense: 8 g; Refill: 12 - budesonide-formoterol (SYMBICORT) 80-4.5 MCG/ACT inhaler; Inhale 2 puffs into the lungs 2 (two) times daily.  Dispense: 10.2 each; Refill: 11  4. Possible pregnancy - hCG, serum, qualitative  5. Irregular periods  6. Seasonal allergies - cetirizine (ZYRTEC) 10 MG tablet; Take 1 tablet (10 mg total) by mouth daily.  Dispense: 30 tablet; Refill: 11  7. Premenopausal patient  8. Colon cancer screening Family history of Colon [place]er. Declines Colonoscopy method of testing. We will order Cologuard testing.   9. Generalized abdominal discomfort We will get Abdominal Ultrasound today, as patient has a family history of Cervical Cancer.   10. Breast cancer screening, high risk patient  11. Family history of breast cancer in sister  9. Family history of cancer Sister has a history of Cervical Cancer.   13. Healthcare maintenance - Urinalysis Dipstick - POC Glucose (CBG) - POC HgB A1c - CBC with Differential - Comprehensive metabolic panel - Lipid Panel - TSH - Vitamin B12 - Vitamin D, 25-hydroxy - MM DIGITAL SCREENING BILATERAL; Future  14. Follow up She will follow up in 1 year and as needed.   Meds ordered this encounter  Medications  . albuterol (PROVENTIL) (2.5 MG/3ML) 0.083% nebulizer solution    Sig: Take 3 mLs (2.5 mg total) by nebulization every 6 (six) hours as needed for wheezing or shortness of breath.    Dispense:  75 mL    Refill:  11  . albuterol (VENTOLIN HFA) 108 (90 Base) MCG/ACT inhaler    Sig: Inhale 2 puffs into the lungs every 6 (six) hours as needed for wheezing or shortness of breath.    Dispense:  8 g    Refill:  12  . budesonide-formoterol (SYMBICORT) 80-4.5 MCG/ACT inhaler    Sig: Inhale 2 puffs into the lungs 2 (two)  times daily.    Dispense:  10.2 each    Refill:  11  . gemfibrozil (LOPID) 600 MG tablet    Sig: Take 1 tablet (600 mg total) by mouth 2 (two) times daily before a meal.    Dispense:  60 tablet    Refill:  11  . cetirizine (ZYRTEC) 10 MG tablet    Sig: Take 1 tablet (10 mg total) by mouth daily.    Dispense:  30 tablet    Refill:  11    Orders Placed This Encounter  Procedures  . MM DIGITAL SCREENING BILATERAL  . CBC with Differential  . Comprehensive metabolic panel  . Lipid Panel  . TSH  . Vitamin B12  . Vitamin D, 25-hydroxy  . hCG, serum, qualitative  . Urinalysis Dipstick  . POC Glucose (CBG)  . POC HgB A1c   Raliegh Ip,  MSN, FNP-BC [facility] [address] [place], [place] [address] [phone] fax   Problem List Items Addressed This Visit  Other   Irregular periods   Premenopausal patient   Seasonal allergies   Relevant Medications   cetirizine (ZYRTEC) 10 MG tablet    Other Visit Diagnoses    Annual physical exam    -  Primary   Hypertriglyceridemia       Relevant Medications   gemfibrozil (LOPID) 600 MG tablet   Other Relevant Orders   CBC with Differential   Comprehensive metabolic panel   Lipid Panel   TSH   Vitamin B12   Vitamin D, 25-hydroxy   MM DIGITAL SCREENING BILATERAL   Shortness of breath       Relevant Medications   albuterol (PROVENTIL) (2.5 MG/3ML) 0.083% nebulizer solution   albuterol (VENTOLIN HFA) 108 (90 Base) MCG/ACT inhaler   budesonide-formoterol (SYMBICORT) 80-4.5 MCG/ACT inhaler   Possible pregnancy       Relevant Orders   hCG, serum, qualitative   Colon cancer screening       Generalized abdominal discomfort       Breast cancer screening, high risk patient       Family history of breast cancer in sister       Family history of cancer       Healthcare maintenance       Relevant Orders   Urinalysis  Dipstick (Completed)   POC Glucose (CBG) (Completed)   POC HgB A1c (Completed)   CBC with Differential   Comprehensive metabolic panel   Lipid Panel   TSH   Vitamin B12   Vitamin D, 25-hydroxy   MM DIGITAL SCREENING BILATERAL   Follow up          Meds ordered this encounter  Medications  . albuterol (PROVENTIL) (2.5 MG/3ML) 0.083% nebulizer solution    Sig: Take 3 mLs (2.5 mg total) by nebulization every 6 (six) hours as needed for wheezing or shortness of breath.    Dispense:  75 mL    Refill:  11  . albuterol (VENTOLIN HFA) 108 (90 Base) MCG/ACT inhaler    Sig: Inhale 2 puffs into the lungs every 6 (six) hours as needed for wheezing or shortness of breath.    Dispense:  8 g    Refill:  12  . budesonide-formoterol (SYMBICORT) 80-4.5 MCG/ACT inhaler    Sig: Inhale 2 puffs into the lungs 2 (two) times daily.    Dispense:  10.2 each    Refill:  11  . gemfibrozil (LOPID) 600 MG tablet    Sig: Take 1 tablet (600 mg total) by mouth 2 (two) times daily before a meal.    Dispense:  60 tablet    Refill:  11  . cetirizine (ZYRTEC) 10 MG tablet    Sig: Take 1 tablet (10 mg total) by mouth daily.    Dispense:  30 tablet    Refill:  11    Follow-up: Return in about 1 year (around 08/03/2021).    Kallie LocksNatalie M Rockford Leinen, FNP

## 2020-08-04 ENCOUNTER — Encounter: Payer: Self-pay | Admitting: Family Medicine

## 2020-08-04 LAB — CBC WITH DIFFERENTIAL/PLATELET
Basophils Absolute: 0.1 10*3/uL (ref 0.0–0.2)
Basos: 1 %
EOS (ABSOLUTE): 0.2 10*3/uL (ref 0.0–0.4)
Eos: 3 %
Hematocrit: 41.6 % (ref 34.0–46.6)
Hemoglobin: 14 g/dL (ref 11.1–15.9)
Immature Grans (Abs): 0 10*3/uL (ref 0.0–0.1)
Immature Granulocytes: 0 %
Lymphocytes Absolute: 2.5 10*3/uL (ref 0.7–3.1)
Lymphs: 31 %
MCH: 28.9 pg (ref 26.6–33.0)
MCHC: 33.7 g/dL (ref 31.5–35.7)
MCV: 86 fL (ref 79–97)
Monocytes Absolute: 0.6 10*3/uL (ref 0.1–0.9)
Monocytes: 7 %
Neutrophils Absolute: 4.7 10*3/uL (ref 1.4–7.0)
Neutrophils: 58 %
Platelets: 364 10*3/uL (ref 150–450)
RBC: 4.85 x10E6/uL (ref 3.77–5.28)
RDW: 12.8 % (ref 11.7–15.4)
WBC: 8.1 10*3/uL (ref 3.4–10.8)

## 2020-08-04 LAB — COMPREHENSIVE METABOLIC PANEL
ALT: 18 IU/L (ref 0–32)
AST: 18 IU/L (ref 0–40)
Albumin/Globulin Ratio: 1.6 (ref 1.2–2.2)
Albumin: 4.4 g/dL (ref 3.8–4.9)
Alkaline Phosphatase: 84 IU/L (ref 48–121)
BUN/Creatinine Ratio: 15 (ref 9–23)
BUN: 10 mg/dL (ref 6–24)
Bilirubin Total: 0.3 mg/dL (ref 0.0–1.2)
CO2: 22 mmol/L (ref 20–29)
Calcium: 9.4 mg/dL (ref 8.7–10.2)
Chloride: 104 mmol/L (ref 96–106)
Creatinine, Ser: 0.66 mg/dL (ref 0.57–1.00)
GFR calc Af Amer: 116 mL/min/{1.73_m2} (ref >59)
GFR calc non Af Amer: 100 mL/min/{1.73_m2} (ref >59)
Globulin, Total: 2.8 g/dL (ref 1.5–4.5)
Glucose: 100 mg/dL — ABNORMAL HIGH (ref 65–99)
Potassium: 4.9 mmol/L (ref 3.5–5.2)
Sodium: 141 mmol/L (ref 134–144)
Total Protein: 7.2 g/dL (ref 6.0–8.5)

## 2020-08-04 LAB — LIPID PANEL
Chol/HDL Ratio: 3.1 ratio (ref 0.0–4.4)
Cholesterol, Total: 175 mg/dL (ref 100–199)
HDL: 57 mg/dL (ref >39)
LDL Chol Calc (NIH): 99 mg/dL (ref 0–99)
Triglycerides: 103 mg/dL (ref 0–149)
VLDL Cholesterol Cal: 19 mg/dL (ref 5–40)

## 2020-08-04 LAB — TSH: TSH: 2.05 u[IU]/mL (ref 0.450–4.500)

## 2020-08-04 LAB — VITAMIN D 25 HYDROXY (VIT D DEFICIENCY, FRACTURES): Vit D, 25-Hydroxy: 22 ng/mL — ABNORMAL LOW (ref 30.0–100.0)

## 2020-08-04 LAB — VITAMIN B12: Vitamin B-12: >2000 pg/mL — ABNORMAL HIGH (ref 232–1245)

## 2020-08-04 LAB — HCG, SERUM, QUALITATIVE: hCG,Beta Subunit,Qual,Serum: NEGATIVE m[IU]/mL (ref <6)

## 2020-08-04 MED ORDER — GEMFIBROZIL 600 MG PO TABS: 600.0000 mg | ORAL_TABLET | Freq: Two times a day (BID) | ORAL | 11 refills | Status: AC

## 2020-08-04 MED ORDER — ALBUTEROL SULFATE (2.5 MG/3ML) 0.083% IN NEBU: 2.5000 mg | INHALATION_SOLUTION | Freq: Four times a day (QID) | RESPIRATORY_TRACT | 11 refills | Status: AC | PRN

## 2020-08-04 MED ORDER — BUDESONIDE-FORMOTEROL FUMARATE 80-4.5 MCG/ACT IN AERO: 2.0000 | INHALATION_SPRAY | Freq: Two times a day (BID) | RESPIRATORY_TRACT | 11 refills | Status: AC

## 2020-08-04 MED ORDER — CETIRIZINE HCL 10 MG PO TABS: 10.0000 mg | ORAL_TABLET | Freq: Every day | ORAL | 11 refills | Status: AC

## 2020-08-04 MED ORDER — ALBUTEROL SULFATE HFA 108 (90 BASE) MCG/ACT IN AERS: 2.0000 | INHALATION_SPRAY | Freq: Four times a day (QID) | RESPIRATORY_TRACT | 12 refills | Status: DC | PRN

## 2020-08-04 MED FILL — !SYMBICORT 80-4.5 MCG INH: 80-4.5 | 30 days supply | Qty: 1 | Fill #0

## 2020-08-04 MED FILL — ALBUTEROL SULFATE HFA 108 (: 108 (90 BAS | 25 days supply | Qty: 18 | Fill #0

## 2020-08-04 MED FILL — GEMFIBROZIL 600 MG TAB: 600 | 30 days supply | Qty: 60 | Fill #0

## 2020-08-04 MED FILL — ALBUTEROL 0.083% INHAL SOLN: (2.5 MG/3ML | 6 days supply | Qty: 75 | Fill #0

## 2020-08-05 ENCOUNTER — Telehealth: Payer: Self-pay

## 2020-08-05 NOTE — Telephone Encounter (Signed)
Telephoned patient at home number. Left a voice message with [facility] contact information. 

## 2020-08-09 NOTE — Telephone Encounter (Signed)
-----   Message from Kallie Locks, FNP sent at 08/04/2020  8:12 PM EDT ----- All labs are stable. Vitamin D level is mildly decreased. We will not send refill for Vitamin D supplement at this time. She should incorporate a MVI. She will include foods that are high in Vitamin D. These include: Salmon, Cod Liver Oil, Mushrooms, Canned Fish, Milk, and Egg Yolks.   Keep follow up appointment as scheduled. Nurse to review steps to CRCS (Colorectal Cancer Screening), order placed for Mammogram.   Please schedule patient for Pap Smear only appointment. Thanks Ramie Erman!

## 2020-08-10 ENCOUNTER — Encounter: Payer: Self-pay | Admitting: Family Medicine

## 2020-08-12 ENCOUNTER — Encounter: Payer: Self-pay | Admitting: Family Medicine

## 2020-09-02 LAB — COLOGUARD: Cologuard: NEGATIVE

## 2020-09-03 ENCOUNTER — Other Ambulatory Visit: Payer: Self-pay

## 2020-09-03 ENCOUNTER — Emergency Department (HOSPITAL_COMMUNITY)
Admission: EM | Admit: 2020-09-03 | Discharge: 2020-09-03 | Disposition: A | Discharge status: Home / Self Care | Payer: Medicaid Other | Attending: Emergency Medicine | Admitting: Emergency Medicine

## 2020-09-03 ENCOUNTER — Encounter (HOSPITAL_COMMUNITY): Payer: Self-pay

## 2020-09-03 DIAGNOSIS — Z87891 Personal history of nicotine dependence: Secondary | ICD-10-CM | POA: Insufficient documentation

## 2020-09-03 DIAGNOSIS — R112 Nausea with vomiting, unspecified: Secondary | ICD-10-CM

## 2020-09-03 DIAGNOSIS — R197 Diarrhea, unspecified: Secondary | ICD-10-CM | POA: Insufficient documentation

## 2020-09-03 DIAGNOSIS — R509 Fever, unspecified: Secondary | ICD-10-CM | POA: Insufficient documentation

## 2020-09-03 DIAGNOSIS — R531 Weakness: Secondary | ICD-10-CM | POA: Insufficient documentation

## 2020-09-03 DIAGNOSIS — M791 Myalgia, unspecified site: Secondary | ICD-10-CM | POA: Insufficient documentation

## 2020-09-03 DIAGNOSIS — Z20822 Contact with and (suspected) exposure to covid-19: Secondary | ICD-10-CM | POA: Insufficient documentation

## 2020-09-03 DIAGNOSIS — Z7951 Long term (current) use of inhaled steroids: Secondary | ICD-10-CM | POA: Insufficient documentation

## 2020-09-03 DIAGNOSIS — R519 Headache, unspecified: Secondary | ICD-10-CM | POA: Insufficient documentation

## 2020-09-03 LAB — COMPREHENSIVE METABOLIC PANEL
ALT: 19 U/L (ref 0–44)
AST: 21 U/L (ref 15–41)
Albumin: 3.6 g/dL (ref 3.5–5.0)
Alkaline Phosphatase: 58 U/L (ref 38–126)
Anion gap: 10 (ref 5–15)
BUN: 14 mg/dL (ref 6–20)
CO2: 24 mmol/L (ref 22–32)
Calcium: 8.5 mg/dL — ABNORMAL LOW (ref 8.9–10.3)
Chloride: 102 mmol/L (ref 98–111)
Creatinine, Ser: 0.5 mg/dL (ref 0.44–1.00)
GFR, Estimated: >60 mL/min (ref >60)
Glucose, Bld: 122 mg/dL — ABNORMAL HIGH (ref 70–99)
Potassium: 3.6 mmol/L (ref 3.5–5.1)
Sodium: 136 mmol/L (ref 135–145)
Total Bilirubin: 0.8 mg/dL (ref 0.3–1.2)
Total Protein: 6.7 g/dL (ref 6.5–8.1)

## 2020-09-03 LAB — CBC WITH DIFFERENTIAL/PLATELET
Abs Immature Granulocytes: 0.03 10*3/uL (ref 0.00–0.07)
Basophils Absolute: 0.1 10*3/uL (ref 0.0–0.1)
Basophils Relative: 1 %
Eosinophils Absolute: 0 10*3/uL (ref 0.0–0.5)
Eosinophils Relative: 1 %
HCT: 40.3 % (ref 36.0–46.0)
Hemoglobin: 13 g/dL (ref 12.0–15.0)
Immature Granulocytes: 0 %
Lymphocytes Relative: 14 %
Lymphs Abs: 1.2 10*3/uL (ref 0.7–4.0)
MCH: 29.3 pg (ref 26.0–34.0)
MCHC: 32.3 g/dL (ref 30.0–36.0)
MCV: 90.8 fL (ref 80.0–100.0)
Monocytes Absolute: 0.5 10*3/uL (ref 0.1–1.0)
Monocytes Relative: 6 %
Neutro Abs: 7 10*3/uL (ref 1.7–7.7)
Neutrophils Relative %: 78 %
Platelets: 284 10*3/uL (ref 150–400)
RBC: 4.44 MIL/uL (ref 3.87–5.11)
RDW: 14.3 % (ref 11.5–15.5)
WBC: 8.8 10*3/uL (ref 4.0–10.5)
nRBC: 0 % (ref 0.0–0.2)

## 2020-09-03 LAB — I-STAT BETA HCG BLOOD, ED (MC, WL, AP ONLY): I-stat hCG, quantitative: <5 m[IU]/mL (ref <5)

## 2020-09-03 LAB — RESPIRATORY PANEL BY RT PCR (FLU A&B, COVID)
Influenza A by PCR: NEGATIVE
Influenza B by PCR: NEGATIVE
SARS Coronavirus 2 by RT PCR: NEGATIVE

## 2020-09-03 LAB — LIPASE, BLOOD: Lipase: 23 U/L (ref 11–51)

## 2020-09-03 MED ORDER — METOCLOPRAMIDE HCL 5 MG/ML IJ SOLN
10.0000 mg | Freq: Once | INTRAMUSCULAR | Status: AC
  Administered 2020-09-03: 10 mg via INTRAVENOUS
  Filled 2020-09-03: qty 2

## 2020-09-03 MED ORDER — DIPHENHYDRAMINE HCL 50 MG/ML IJ SOLN
25.0000 mg | Freq: Once | INTRAMUSCULAR | Status: AC
  Administered 2020-09-03: 25 mg via INTRAVENOUS
  Filled 2020-09-03: qty 1

## 2020-09-03 MED ORDER — ONDANSETRON 4 MG PO TBDP: 4.0000 mg | ORAL_TABLET | Freq: Three times a day (TID) | ORAL | 0 refills | Status: DC | PRN

## 2020-09-03 MED ORDER — SODIUM CHLORIDE 0.9 % IV BOLUS
1000.0000 mL | Freq: Once | INTRAVENOUS | Status: AC
  Administered 2020-09-03: 1000 mL via INTRAVENOUS

## 2020-09-03 MED ORDER — DICYCLOMINE HCL 10 MG/ML IM SOLN
20.0000 mg | Freq: Once | INTRAMUSCULAR | Status: AC
  Administered 2020-09-03: 20 mg via INTRAMUSCULAR
  Filled 2020-09-03: qty 2

## 2020-09-03 MED ORDER — DICYCLOMINE HCL 20 MG PO TABS: 20.0000 mg | ORAL_TABLET | Freq: Two times a day (BID) | ORAL | 0 refills | Status: AC

## 2020-09-03 NOTE — Discharge Instructions (Signed)
Take medications as needed to help for your symptoms. Make sure you are drinking plenty of fluids and slowly advancing your diet as tolerated. Return to the ER if you start to experience worsening pain, headache, dizziness or chest pain.

## 2020-09-03 NOTE — ED Triage Notes (Signed)
Generalized weakness, n/v/d, temp max 101.4 x1 day. Patient states vaccinated.

## 2020-09-03 NOTE — ED Provider Notes (Signed)
[facility]-EMERGENCY DEPT Provider Note   CSN: [id number] Arrival date & time: 09/03/20  1017     History Chief Complaint  Patient presents with  . Covid Exposure    Kathy Hamilton is a 54 y.o. female with a past medical history of hyperlipidemia presenting to the ED with a chief complaint of generalized weakness, myalgias, headache, vomiting and diarrhea since yesterday.  Reports fever with T-max of 101.4 which improved with Tylenol.  States that there have been several coworkers that have tested positive for Covid.  Patient herself has been fully vaccinated against Covid.  She denies any significant cough, shortness of breath or chest pain.  Denies supplemental oxygen use at baseline.  States that she ate a meal at [employer] yesterday morning and is unsure if this caused her symptoms.  Did not take any medications this morning.  Denies any neck stiffness, rash, wheezing, leg swelling, recent travel.  Prior abdominal surgeries include cholecystectomy.  Denies daily alcohol use.  HPI     Past Medical History:  Diagnosis Date  . Bronchitis   . Community acquired pneumonia   . Family history of colon cancer   . H/O tubal ligation 09/2019  . Hypertriglyceridemia   . Premenopausal patient   . Seasonal allergies   . Shortness of breath     Patient Active Problem List   Diagnosis Date Noted  . Skin rash 10/14/2019  . Skin infection 10/14/2019  . Irregular periods 04/11/2019  . Premenopausal patient 04/11/2019  . Dyspnea on exertion 04/11/2019  . Seasonal allergies 04/11/2019  . Vitamin D deficiency 04/11/2019  . Family history of diabetes mellitus 03/27/2016  . Family history of hypercholesterolemia 03/27/2016    Past Surgical History:  Procedure Laterality Date  . CHOLECYSTECTOMY    . ORTHOPEDIC SURGERY Left 2013   rotator cuff  . TUBAL LIGATION       OB History    Gravida  5   Para      Term      Preterm      AB      Living  4     SAB       TAB      Ectopic      Multiple      Live Births              Family History  Problem Relation Age of Onset  . Cancer Mother   . Stroke Mother   . Heart attack Mother   . Colon cancer Mother   . Bipolar disorder Sister   . Breast cancer Sister   . Cervical cancer Sister   . Stroke Brother   . Asthma Daughter   . Bipolar disorder Daughter   . ADD / ADHD Son   . Asperger's syndrome Son     Social History   Tobacco Use  . Smoking status: Former Games developer  . Smokeless tobacco: Never Used  Vaping Use  . Vaping Use: Never used  Substance Use Topics  . Alcohol use: No  . Drug use: No    Home Medications Prior to Admission medications   Medication Sig Start Date End Date Taking? Authorizing Provider  albuterol (PROVENTIL) (2.5 MG/3ML) 0.083% nebulizer solution Take 3 mLs (2.5 mg total) by nebulization every 6 (six) hours as needed for wheezing or shortness of breath. 08/04/20   Kallie Locks, FNP  albuterol (VENTOLIN HFA) 108 (90 Base) MCG/ACT inhaler Inhale 2 puffs into the lungs every 6 (  six) hours as needed for wheezing or shortness of breath. 08/04/20   Kallie Locks, FNP  budesonide-formoterol (SYMBICORT) 80-4.5 MCG/ACT inhaler Inhale 2 puffs into the lungs 2 (two) times daily. 08/04/20   Kallie Locks, FNP  cetirizine (ZYRTEC) 10 MG tablet Take 1 tablet (10 mg total) by mouth daily. 08/04/20   Kallie Locks, FNP  dicyclomine (BENTYL) 20 MG tablet Take 1 tablet (20 mg total) by mouth 2 (two) times daily. 09/03/20   Aizik Reh, PA-C  gemfibrozil (LOPID) 600 MG tablet Take 1 tablet (600 mg total) by mouth 2 (two) times daily before a meal. 08/04/20   Kallie Locks, FNP  ondansetron (ZOFRAN ODT) 4 MG disintegrating tablet Take 1 tablet (4 mg total) by mouth every 8 (eight) hours as needed for nausea or vomiting. 09/03/20   Jes Costales, PA-C  Vitamin D, Ergocalciferol, (DRISDOL) 1.25 MG (50000 UT) CAPS capsule Take 1 capsule (50,000 Units total) by mouth every 7  (seven) days. Patient not taking: Reported on 08/03/2020 10/13/19   Kallie Locks, FNP    Allergies    Oxycodone  Review of Systems   Review of Systems  Constitutional: Positive for chills and fever. Negative for appetite change.  HENT: Negative for ear pain, rhinorrhea, sneezing and sore throat.   Eyes: Negative for photophobia and visual disturbance.  Respiratory: Negative for cough, chest tightness, shortness of breath and wheezing.   Cardiovascular: Negative for chest pain and palpitations.  Gastrointestinal: Positive for abdominal pain, diarrhea, nausea and vomiting. Negative for blood in stool and constipation.  Genitourinary: Negative for dysuria, hematuria and urgency.  Musculoskeletal: Positive for myalgias.  Skin: Negative for rash.  Neurological: Negative for dizziness, weakness and light-headedness.    Physical Exam Updated Vital Signs BP (!) 155/80   Pulse 95   Temp 99.2 F (37.3 C) (Oral)   Resp (!) 24   Ht 5\' 5"  (1.651 m)   Wt 85.3 kg   SpO2 100%   BMI 31.28 kg/m   Physical Exam Vitals and nursing note reviewed.  Constitutional:      General: She is not in acute distress.    Appearance: She is well-developed.     Comments: No respiratory distress.  HENT:     Head: Normocephalic and atraumatic.     Nose: Nose normal.  Eyes:     General: No scleral icterus.       Right eye: No discharge.        Left eye: No discharge.     Conjunctiva/sclera: Conjunctivae normal.  Cardiovascular:     Rate and Rhythm: Normal rate and regular rhythm.     Heart sounds: Normal heart sounds. No murmur heard.  No friction rub. No gallop.   Pulmonary:     Effort: Pulmonary effort is normal. No respiratory distress.     Breath sounds: Normal breath sounds.  Abdominal:     General: Bowel sounds are normal. There is no distension.     Palpations: Abdomen is soft.     Tenderness: There is abdominal tenderness (Generalized). There is no guarding.  Musculoskeletal:         General: Normal range of motion.     Cervical back: Normal range of motion and neck supple.  Skin:    General: Skin is warm and dry.     Findings: No rash.  Neurological:     General: No focal deficit present.     Mental Status: She is alert and oriented to person,  place, and time.     Cranial Nerves: No cranial nerve deficit.     Sensory: No sensory deficit.     Motor: No weakness or abnormal muscle tone.     Coordination: Coordination normal.     ED Results / Procedures / Treatments   Labs (all labs ordered are listed, but only abnormal results are displayed) Labs Reviewed  COMPREHENSIVE METABOLIC PANEL - Abnormal; Notable for the following components:      Result Value   Glucose, Bld 122 (*)    Calcium 8.5 (*)    All other components within normal limits  RESPIRATORY PANEL BY RT PCR (FLU A&B, COVID)  CBC WITH DIFFERENTIAL/PLATELET  LIPASE, BLOOD  I-STAT BETA HCG BLOOD, ED ([facility], [facility], [facility] ONLY)    EKG None  Radiology No results found.  Procedures Procedures (including critical care time)  Medications Ordered in ED Medications  sodium chloride 0.9 % bolus 1,000 mL (0 mLs Intravenous Stopped 09/03/20 1327)  metoCLOPramide (REGLAN) injection 10 mg (10 mg Intravenous Given 09/03/20 1127)  diphenhydrAMINE (BENADRYL) injection 25 mg (25 mg Intravenous Given 09/03/20 1127)  dicyclomine (BENTYL) injection 20 mg (20 mg Intramuscular Given 09/03/20 1328)    ED Course  I have reviewed the triage vital signs and the nursing notes.  Pertinent labs & imaging results that were available during my care of the patient were reviewed by me and considered in my medical decision making (see chart for details).    MDM Rules/Calculators/A&P                          Kathy Hamilton was evaluated in Emergency Department on 09/03/20 for the symptoms described in the history of present illness. He/she was evaluated in the context of the global COVID-19 pandemic, which necessitated consideration  that the patient might be at risk for infection with the SARS-CoV-2 virus that causes COVID-19. Institutional protocols and algorithms that pertain to the evaluation of patients at risk for COVID-19 are in a state of rapid change based on information released by regulatory bodies including the CDC and federal and state organizations. These policies and algorithms were followed during the patient's care in the ED.  54yo F with a past medical history of hyperlipidemia presenting to the ED with a chief complaint of generalized weakness.  Yesterday started experiencing myalgias, headache, vomiting and diarrhea.  Fever with T-max 101.4 which improved with Tylenol.  She ate a meal at [employer] yesterday and is concerned that this caused her symptoms.  She does have several coworkers that has tested positive for Covid.  She has been fully vaccinated against Covid herself.  No cough, shortness of breath or chest pain.  On exam there is generalized abdominal tenderness without rebound or guarding.  She is afebrile without recent use of antipyretics.  Lungs are clear to auscultation bilaterally.  Work-up significant for unremarkable CBC, CMP, lipase.  Flu and Covid testing are negative.  Patient given IV fluids, migraine cocktail here with improvement in her symptoms.  Reports ongoing intermittent cramping for which she was given Bentyl.  Repeat abdominal exams are benign.  Suspect viral cause of her symptoms.  She is concerned that she may have gotten food poisoning.  Will discharge home with antiemetics, Bentyl, advised her to increase hydration and slowly advance her diet as tolerated.  Doubt appendicitis, diverticulitis or other emergent cause of her symptoms.  Patient is agreeable to the plan.  Return precautions given.  Patient is hemodynamically stable, in NAD, and able to ambulate in the ED. Evaluation does not show pathology that would require ongoing emergent intervention or inpatient treatment. I explained the  diagnosis to the patient. Pain has been managed and has no complaints prior to discharge. Patient is comfortable with above plan and is stable for discharge at this time. All questions were answered prior to disposition. Strict return precautions for returning to the ED were discussed. Encouraged follow up with PCP.   An After Visit Summary was printed and given to the patient.   Portions of this note were generated with Scientist, clinical (histocompatibility and immunogenetics)Dragon dictation software. Dictation errors may occur despite best attempts at proofreading.  Final Clinical Impression(s) / ED Diagnoses Final diagnoses:  Nausea vomiting and diarrhea    Rx / DC Orders ED Discharge Orders         Ordered    ondansetron (ZOFRAN ODT) 4 MG disintegrating tablet  Every 8 hours PRN        09/03/20 1338    dicyclomine (BENTYL) 20 MG tablet  2 times daily        09/03/20 1338           Dietrich PatesKhatri, Antone Summons, PA-C 09/03/20 1348    Lorre NickAllen, Anthony, MD 09/06/20 1407

## 2020-09-08 ENCOUNTER — Ambulatory Visit (INDEPENDENT_AMBULATORY_CARE_PROVIDER_SITE_OTHER): Payer: Self-pay | Admitting: Family Medicine

## 2020-09-08 ENCOUNTER — Other Ambulatory Visit: Payer: Self-pay

## 2020-09-08 ENCOUNTER — Encounter: Payer: Self-pay | Admitting: Family Medicine

## 2020-09-08 VITALS — BP 160/91 | HR 81 | Temp 98.6°F | Ht 61.0 in | Wt 188.6 lb

## 2020-09-08 DIAGNOSIS — Z09 Encounter for follow-up examination after completed treatment for conditions other than malignant neoplasm: Secondary | ICD-10-CM

## 2020-09-08 DIAGNOSIS — Z124 Encounter for screening for malignant neoplasm of cervix: Secondary | ICD-10-CM

## 2020-09-08 DIAGNOSIS — R1084 Generalized abdominal pain: Secondary | ICD-10-CM

## 2020-09-08 DIAGNOSIS — Z803 Family history of malignant neoplasm of breast: Secondary | ICD-10-CM

## 2020-09-08 NOTE — Patient Instructions (Signed)

## 2020-09-08 NOTE — Progress Notes (Signed)
[facility]    Pap Smear  Subjective:  Patient ID: Kathy Hamilton, female    DOB: 10-11-1966  Age: 54 y.o. MRN: [id number]  CC:  Chief Complaint  Patient presents with  . Follow-up    Pt states she is here for pap smear and pt states she thinks her blatter is low..  . Abdominal Pain    Pt states on R side X2 1/2 days.    HPI Kathy Hamilton is a 54 year old female who presents for Pap Smear today.    Patient Active Problem List   Diagnosis Date Noted  . Skin rash 10/14/2019  . Skin infection 10/14/2019  . Irregular periods 04/11/2019  . Premenopausal patient 04/11/2019  . Dyspnea on exertion 04/11/2019  . Seasonal allergies 04/11/2019  . Vitamin D deficiency 04/11/2019  . Family history of diabetes mellitus 03/27/2016  . Family history of hypercholesterolemia 03/27/2016    Current Status: Since her last office visit, she is doing well with no complaints. She is here today for Pap Smear. She has c/o right abdominal pain X 2 1/2 days now. Denies GI problems such as nausea, vomiting, diarrhea, and constipation. She has no reports of blood in stools, dysuria and hematuria. She denies fevers, chills, fatigue, recent infections, weight loss, and night sweats. She has not had any headaches, visual changes, dizziness, and falls. No chest pain, heart palpitations, cough and shortness of breath reported. . No depression or anxiety, and denies suicidal ideations, homicidal ideations, or auditory hallucinations. She is taking all medications as prescribed. She denies pain today.   Past Medical History:  Diagnosis Date  . Bronchitis   . Community acquired pneumonia   . Family history of colon cancer   . H/O tubal ligation 09/2019  . Hypertriglyceridemia   . Premenopausal patient   . Seasonal allergies   . Shortness of breath     Past Surgical History:  Procedure Laterality Date  . CHOLECYSTECTOMY    . ORTHOPEDIC SURGERY Left 2013    rotator cuff  . TUBAL LIGATION      Family History  Problem Relation Age of Onset  . Cancer Mother   . Stroke Mother   . Heart attack Mother   . Colon cancer Mother   . Bipolar disorder Sister   . Breast cancer Sister   . Cervical cancer Sister   . Stroke Brother   . Asthma Daughter   . Bipolar disorder Daughter   . ADD / ADHD Son   . Asperger's syndrome Son     Social History   Socioeconomic History  . Marital status: Widowed    Spouse name: Not on file  . Number of children: Not on file  . Years of education: Not on file  . Highest education level: Not on file  Occupational History  . Not on file  Tobacco Use  . Smoking status: Former Games developer  . Smokeless tobacco: Never Used  Vaping Use  . Vaping Use: Never used  Substance and Sexual Activity  . Alcohol use: No  . Drug use: No  . Sexual activity: Yes  Other Topics Concern  . Not on file  Social History Narrative  . Not on file   Social Determinants of Health   Financial Resource Strain:   . Difficulty of Paying Living Expenses: Not on file  Food Insecurity:   . Worried About Programme researcher, broadcasting/film/video in the Last Year: Not on file  .  Ran Out of Food in the Last Year: Not on file  Transportation Needs:   . Lack of Transportation (Medical): Not on file  . Lack of Transportation (Non-Medical): Not on file  Physical Activity:   . Days of Exercise per Week: Not on file  . Minutes of Exercise per Session: Not on file  Stress:   . Feeling of Stress : Not on file  Social Connections:   . Frequency of Communication with Friends and Family: Not on file  . Frequency of Social Gatherings with Friends and Family: Not on file  . Attends Religious Services: Not on file  . Active Member of Clubs or Organizations: Not on file  . Attends Banker Meetings: Not on file  . Marital Status: Not on file  Intimate Partner Violence:   . Fear of Current or Ex-Partner: Not on file  . Emotionally Abused: Not on file  .  Physically Abused: Not on file  . Sexually Abused: Not on file    Outpatient Medications Prior to Visit  Medication Sig Dispense Refill  . albuterol (PROVENTIL) (2.5 MG/3ML) 0.083% nebulizer solution Take 3 mLs (2.5 mg total) by nebulization every 6 (six) hours as needed for wheezing or shortness of breath. 75 mL 11  . albuterol (VENTOLIN HFA) 108 (90 Base) MCG/ACT inhaler Inhale 2 puffs into the lungs every 6 (six) hours as needed for wheezing or shortness of breath. 8 g 12  . budesonide-formoterol (SYMBICORT) 80-4.5 MCG/ACT inhaler Inhale 2 puffs into the lungs 2 (two) times daily. 10.2 each 11  . cetirizine (ZYRTEC) 10 MG tablet Take 1 tablet (10 mg total) by mouth daily. 30 tablet 11  . dicyclomine (BENTYL) 20 MG tablet Take 1 tablet (20 mg total) by mouth 2 (two) times daily. 7 tablet 0  . gemfibrozil (LOPID) 600 MG tablet Take 1 tablet (600 mg total) by mouth 2 (two) times daily before a meal. 60 tablet 11  . ondansetron (ZOFRAN ODT) 4 MG disintegrating tablet Take 1 tablet (4 mg total) by mouth every 8 (eight) hours as needed for nausea or vomiting. 4 tablet 0  . Vitamin D, Ergocalciferol, (DRISDOL) 1.25 MG (50000 UT) CAPS capsule Take 1 capsule (50,000 Units total) by mouth every 7 (seven) days. 5 capsule 6   No facility-administered medications prior to visit.    Allergies  Allergen Reactions  . Oxycodone     Unknown reaction     ROS Review of Systems  Constitutional: Negative.   HENT: Negative.   Eyes: Negative.   Respiratory: Negative.   Cardiovascular: Negative.   Gastrointestinal: Negative.   Endocrine: Negative.   Genitourinary: Negative.   Musculoskeletal: Positive for arthralgias (generalized. ).  Skin: Negative.   Allergic/Immunologic: Negative.   Neurological: Positive for dizziness (occasional ) and headaches (occsasional ).  Hematological: Negative.   Psychiatric/Behavioral: Negative.    Objective:    Physical Exam Vitals and nursing note reviewed.    Constitutional:      Appearance: She is well-developed.  HENT:     Head: Normocephalic and atraumatic.     Mouth/Throat:     Mouth: Mucous membranes are moist.     Pharynx: Oropharynx is clear.  Cardiovascular:     Rate and Rhythm: Normal rate and regular rhythm.     Heart sounds: Normal heart sounds.  Pulmonary:     Effort: Pulmonary effort is normal.     Breath sounds: Normal breath sounds.  Abdominal:     General: Abdomen is protuberant. Bowel  sounds are normal.     Palpations: Abdomen is soft.  Musculoskeletal:        General: Normal range of motion.  Skin:    General: Skin is warm and dry.  Neurological:     General: No focal deficit present.     Mental Status: She is alert and oriented to person, place, and time.  Psychiatric:        Mood and Affect: Mood normal.        Behavior: Behavior normal.        Thought Content: Thought content normal.        Judgment: Judgment normal.     BP (!) 160/91 (BP Location: Left Arm, Patient Position: Sitting, Cuff Size: Large)   Pulse 81   Temp 98.6 F (37 C)   Ht 5\' 1"  (1.549 m)   Wt 188 lb 9.6 oz (85.5 kg)   LMP 08/13/2020 (Approximate)   SpO2 99%   BMI 35.64 kg/m  Wt Readings from Last 3 Encounters:  09/08/20 188 lb 9.6 oz (85.5 kg)  09/03/20 188 lb (85.3 kg)  08/12/20 188 lb 6.4 oz (85.5 kg)     Health Maintenance Due  Topic Date Due  . Hepatitis C Screening  Never done  . HIV Screening  Never done  . COLONOSCOPY  Never done  . MAMMOGRAM  05/01/2018  . COVID-19 Vaccine (2 - Pfizer 2-dose series) 06/02/2020  . INFLUENZA VACCINE  Never done    There are no preventive care reminders to display for this patient.  Lab Results  Component Value Date   TSH 2.050 08/03/2020   Lab Results  Component Value Date   WBC 8.8 09/03/2020   HGB 13.0 09/03/2020   HCT 40.3 09/03/2020   MCV 90.8 09/03/2020   PLT 284 09/03/2020   Lab Results  Component Value Date   NA 136 09/03/2020   K 3.6 09/03/2020   CO2 24  09/03/2020   GLUCOSE 122 (H) 09/03/2020   BUN 14 09/03/2020   CREATININE 0.50 09/03/2020   BILITOT 0.8 09/03/2020   ALKPHOS 58 09/03/2020   AST 21 09/03/2020   ALT 19 09/03/2020   PROT 6.7 09/03/2020   ALBUMIN 3.6 09/03/2020   CALCIUM 8.5 (L) 09/03/2020   ANIONGAP 10 09/03/2020   Lab Results  Component Value Date   CHOL 175 08/03/2020   Lab Results  Component Value Date   HDL 57 08/03/2020   Lab Results  Component Value Date   LDLCALC 99 08/03/2020   Lab Results  Component Value Date   TRIG 103 08/03/2020   Lab Results  Component Value Date   CHOLHDL 3.1 08/03/2020   Lab Results  Component Value Date   HGBA1C 5.5 08/03/2020   HGBA1C 5.5 08/03/2020   HGBA1C 5.5 (A) 08/03/2020   HGBA1C 5.5 08/03/2020    Assessment & Plan:   1. Encounter for Papanicolaou smear for cervical cancer screening Tolerated Pap Smear exam well with no complaints.  GYN physical assessment within normal for age.  Follow-up for scheduled mammogram as needed.  Recommend monthly self breast exam Recommend daily multivitamin for women Recommend strength training in 150 minutes of cardiovascular exercise per week - IGP, rfx Aptima HPV ASCU  2. Generalized abdominal discomfort  3. Family history of breast cancer in sister  634. Follow up Keep follow up appointment.   No orders of the defined types were placed in this encounter.   No orders of the defined types were placed in this encounter.  Referral Orders  No referral(s) requested today    Raliegh Ip,  MSN, FNP-BC [facility] [address] [place], [place] [address] [phone]- fax   Problem List Items Addressed This Visit    None    Visit Diagnoses    Encounter for Papanicolaou smear for cervical cancer screening    -  Primary   Relevant Orders   IGP, rfx Aptima HPV ASCU (Completed)   Generalized abdominal  discomfort       Family history of breast cancer in sister       Follow up          No orders of the defined types were placed in this encounter.   Follow-up: No follow-ups on file.    Kallie Locks, FNP

## 2020-09-10 LAB — IGP, RFX APTIMA HPV ASCU

## 2020-09-13 ENCOUNTER — Encounter: Payer: Self-pay | Admitting: Family Medicine

## 2020-09-14 ENCOUNTER — Encounter: Payer: Self-pay | Admitting: Family Medicine

## 2020-09-14 NOTE — Progress Notes (Signed)
Pt was called to discuss her lab results. Pt stated she understood her results and will keep her f/u appt. 

## 2020-12-10 ENCOUNTER — Encounter (HOSPITAL_COMMUNITY): Payer: Self-pay | Admitting: *Deleted

## 2020-12-10 ENCOUNTER — Emergency Department (HOSPITAL_COMMUNITY)
Admission: EM | Admit: 2020-12-10 | Discharge: 2020-12-10 | Disposition: A | Discharge status: Home / Self Care | Payer: 59 | Attending: Emergency Medicine | Admitting: Emergency Medicine

## 2020-12-10 ENCOUNTER — Encounter: Payer: Self-pay | Admitting: Family Medicine

## 2020-12-10 ENCOUNTER — Other Ambulatory Visit: Payer: Self-pay

## 2020-12-10 DIAGNOSIS — Z87891 Personal history of nicotine dependence: Secondary | ICD-10-CM | POA: Diagnosis not present

## 2020-12-10 DIAGNOSIS — R059 Cough, unspecified: Secondary | ICD-10-CM | POA: Diagnosis present

## 2020-12-10 DIAGNOSIS — U071 COVID-19: Secondary | ICD-10-CM | POA: Diagnosis not present

## 2020-12-10 DIAGNOSIS — Z20822 Contact with and (suspected) exposure to covid-19: Secondary | ICD-10-CM

## 2020-12-10 LAB — SARS CORONAVIRUS 2 (TAT 6-24 HRS): SARS Coronavirus 2: POSITIVE — AB

## 2020-12-10 NOTE — ED Provider Notes (Signed)
[facility]-EMERGENCY DEPT Provider Note   CSN: [id number] Arrival date & time: 12/10/20  1016     History Chief Complaint  Patient presents with  . Covid Exposure  . Cough  . Headache    Kathy Hamilton is a 55 y.o. female who presents to the ED today with COVID like symptoms for the past 2-3 days. Pt states that her grandson caught COVID from his daycare and brought it home to his mother who then exposed her. She also reports her son received COVID positive results today. She is here for further evaluation given her symptoms and would like to be tested. She complains of a cough, body aches, shortness of breath during coughing fits, headache, fevers with tmax 101.0 yesterday (improved with Tylenol). Pt has been taking Dayquil and Nyquil as well with mild relief. She reports history of asthma and states she has had to use her inhaler more than normal recently. No other complaints at this time.   The history is provided by the patient and medical records.       Past Medical History:  Diagnosis Date  . Bronchitis   . Community acquired pneumonia   . Family history of colon cancer   . H/O tubal ligation 09/2019  . Hypertriglyceridemia   . Premenopausal patient   . Seasonal allergies   . Shortness of breath     Patient Active Problem List   Diagnosis Date Noted  . Skin rash 10/14/2019  . Skin infection 10/14/2019  . Irregular periods 04/11/2019  . Premenopausal patient 04/11/2019  . Dyspnea on exertion 04/11/2019  . Seasonal allergies 04/11/2019  . Vitamin D deficiency 04/11/2019  . Family history of diabetes mellitus 03/27/2016  . Family history of hypercholesterolemia 03/27/2016    Past Surgical History:  Procedure Laterality Date  . CHOLECYSTECTOMY    . ORTHOPEDIC SURGERY Left 2013   rotator cuff  . TUBAL LIGATION       OB History    Gravida  5   Para      Term      Preterm      AB      Living  4     SAB      IAB      Ectopic       Multiple      Live Births              Family History  Problem Relation Age of Onset  . Cancer Mother   . Stroke Mother   . Heart attack Mother   . Colon cancer Mother   . Bipolar disorder Sister   . Breast cancer Sister   . Cervical cancer Sister   . Stroke Brother   . Asthma Daughter   . Bipolar disorder Daughter   . ADD / ADHD Son   . Asperger's syndrome Son     Social History   Tobacco Use  . Smoking status: Former Games developer  . Smokeless tobacco: Never Used  Vaping Use  . Vaping Use: Never used  Substance Use Topics  . Alcohol use: No  . Drug use: No    Home Medications Prior to Admission medications   Medication Sig Start Date End Date Taking? Authorizing Provider  albuterol (PROVENTIL) (2.5 MG/3ML) 0.083% nebulizer solution Take 3 mLs (2.5 mg total) by nebulization every 6 (six) hours as needed for wheezing or shortness of breath. 08/04/20   Kallie Locks, FNP  albuterol (VENTOLIN HFA) 108 (90 Base)  MCG/ACT inhaler Inhale 2 puffs into the lungs every 6 (six) hours as needed for wheezing or shortness of breath. 08/04/20   Kallie Locks, FNP  budesonide-formoterol (SYMBICORT) 80-4.5 MCG/ACT inhaler Inhale 2 puffs into the lungs 2 (two) times daily. 08/04/20   Kallie Locks, FNP  cetirizine (ZYRTEC) 10 MG tablet Take 1 tablet (10 mg total) by mouth daily. 08/04/20   Kallie Locks, FNP  dicyclomine (BENTYL) 20 MG tablet Take 1 tablet (20 mg total) by mouth 2 (two) times daily. 09/03/20   Khatri, Hina, PA-C  gemfibrozil (LOPID) 600 MG tablet Take 1 tablet (600 mg total) by mouth 2 (two) times daily before a meal. 08/04/20   Kallie Locks, FNP  ondansetron (ZOFRAN ODT) 4 MG disintegrating tablet Take 1 tablet (4 mg total) by mouth every 8 (eight) hours as needed for nausea or vomiting. 09/03/20   Khatri, Hina, PA-C  Vitamin D, Ergocalciferol, (DRISDOL) 1.25 MG (50000 UT) CAPS capsule Take 1 capsule (50,000 Units total) by mouth every 7 (seven) days. 10/13/19    Kallie Locks, FNP    Allergies    Oxycodone  Review of Systems   Review of Systems  Constitutional: Positive for chills, fatigue and fever.  Eyes: Negative for visual disturbance.  Respiratory: Positive for cough and shortness of breath (during coughing spells).   Musculoskeletal: Positive for myalgias.  Neurological: Positive for headaches.  All other systems reviewed and are negative.   Physical Exam Updated Vital Signs BP 136/68 (BP Location: Right Arm)   Pulse 78   Temp 98.1 F (36.7 C) (Oral)   Resp 14   Wt 85.4 kg   SpO2 97%   BMI 35.57 kg/m   Physical Exam Vitals and nursing note reviewed.  Constitutional:      Appearance: She is ill-appearing. She is not diaphoretic.  HENT:     Head: Normocephalic and atraumatic.     Mouth/Throat:     Mouth: Mucous membranes are moist.  Eyes:     Conjunctiva/sclera: Conjunctivae normal.  Cardiovascular:     Rate and Rhythm: Normal rate and regular rhythm.     Heart sounds: Normal heart sounds.     Comments: 2+ radial pulses bilaterally  Pulmonary:     Effort: Pulmonary effort is normal.     Breath sounds: Normal breath sounds. No wheezing, rhonchi or rales.     Comments: Substernal chest wall TTP without crepitus or deformity Chest:     Chest wall: Tenderness present.  Abdominal:     Palpations: Abdomen is soft.     Tenderness: There is no abdominal tenderness.  Musculoskeletal:     Cervical back: Neck supple.  Skin:    General: Skin is warm and dry.  Neurological:     Mental Status: She is alert.     ED Results / Procedures / Treatments   Labs (all labs ordered are listed, but only abnormal results are displayed) Labs Reviewed  SARS CORONAVIRUS 2 (TAT 6-24 HRS)    EKG None  Radiology No results found.  Procedures Procedures (including critical care time)  Medications Ordered in ED Medications - No data to display  ED Course  I have reviewed the triage vital signs and the nursing  notes.  Pertinent labs & imaging results that were available during my care of the patient were reviewed by me and considered in my medical decision making (see chart for details).    MDM Rules/Calculators/A&P  55 year old female who presents to the ED today with COVID-19 since for the past 2 to 3 days with positive exposure.  Patient is vaccinated x2 without booster, most recent dose in July.  Multiple family members are symptomatic and have tested positive for COVID including her son history brought to the ED for further evaluation today.  On arrival to the ED patient is afebrile, nontachycardic and nontachypneic.  She is noted to be bradycardic on arrival at 47 however on my exam patient without bradycardia.  We will plan for an EKG to assess for this however suspect that that was a triage error.  On exam patient is able to speak in full sentences without difficulty and satting 96 to 97% on room air.  She does have some substernal chest wall tenderness palpation however I suspect this is secondary to costochondritis from excessive coughing.  Given overall well appearance I do not feel patient needs additional labs or imaging at this time as I suspect her symptoms are likely related to COVID.  Patient will be swabbed for COVID and given instructions to self isolate until she receives her results.  She is in agreement with plan.  She will follow-up with her PCP.  Strict return precautions discussed.   EKG with normal sinus rhythm 79 with mild PVCs.  Will discharge patient home at this time.   This note was prepared using Dragon voice recognition software and may include unintentional dictation errors due to the inherent limitations of voice recognition software.  Kathy Hamilton was evaluated in Emergency Department on 12/10/2020 for the symptoms described in the history of present illness. She was evaluated in the context of the global COVID-19 pandemic, which necessitated  consideration that the patient might be at risk for infection with the SARS-CoV-2 virus that causes COVID-19. Institutional protocols and algorithms that pertain to the evaluation of patients at risk for COVID-19 are in a state of rapid change based on information released by regulatory bodies including the CDC and federal and state organizations. These policies and algorithms were followed during the patient's care in the ED.  Final Clinical Impression(s) / ED Diagnoses Final diagnoses:  Exposure to COVID-19 virus    Rx / DC Orders ED Discharge Orders    None       Discharge Instructions     Please await your COVID 19 results - if positive we will call you. You can also log into mychart and check the results that way.   If positive you will need to self isolate for 5 days from symptom onset. If your symptoms are improving on Day 5 then you can resume normal activity (with mask wearing for an additional 5 days). If no improvement on Day 5 then you will need to self isolate for an additional 5 days.   Continue taking DayQuil and Nyquil as needed for your symptoms. Check temperatures and take Tylenol as needed for fevers > 100.4. Drink plenty of fluids and stay hydrated.   Follow up with your PCP regarding your ED visit today  Return to the ED IMMEDIATELY for any worsening symptoms including severe chest pain, shortness of breath, passing out, lips/fingers turning blue, inability to awaken easily, new onset confusion, or any other new/concerning symptoms        Tanda Rockers, PA-C 12/10/20 1158    Vanetta Mulders, MD 12/16/20 1756

## 2020-12-10 NOTE — ED Triage Notes (Signed)
Exposed to Covid with multiple complaints of headache, fever, body aches, cough for 2 days

## 2020-12-10 NOTE — Discharge Instructions (Addendum)
Please await your COVID 19 results - if positive we will call you. You can also log into mychart and check the results that way.   If positive you will need to self isolate for 5 days from symptom onset. If your symptoms are improving on Day 5 then you can resume normal activity (with mask wearing for an additional 5 days). If no improvement on Day 5 then you will need to self isolate for an additional 5 days.   Continue taking DayQuil and Nyquil as needed for your symptoms. Check temperatures and take Tylenol as needed for fevers > 100.4. Drink plenty of fluids and stay hydrated.   Follow up with your PCP regarding your ED visit today  Return to the ED IMMEDIATELY for any worsening symptoms including severe chest pain, shortness of breath, passing out, lips/fingers turning blue, inability to awaken easily, new onset confusion, or any other new/concerning symptoms

## 2020-12-11 ENCOUNTER — Encounter: Payer: Self-pay | Admitting: Family Medicine

## 2020-12-15 ENCOUNTER — Encounter: Payer: Self-pay | Admitting: Family Medicine

## 2020-12-17 ENCOUNTER — Other Ambulatory Visit: Payer: Self-pay | Admitting: Family Medicine

## 2020-12-17 DIAGNOSIS — R059 Cough, unspecified: Secondary | ICD-10-CM

## 2020-12-17 DIAGNOSIS — B342 Coronavirus infection, unspecified: Secondary | ICD-10-CM

## 2020-12-17 MED ORDER — AMOXICILLIN-POT CLAVULANATE 875-125 MG PO TABS: 1.0000 | ORAL_TABLET | Freq: Two times a day (BID) | ORAL | 0 refills | Status: AC

## 2020-12-17 MED ORDER — BENZONATATE 100 MG PO CAPS: 100.0000 mg | ORAL_CAPSULE | Freq: Two times a day (BID) | ORAL | 0 refills | Status: DC | PRN

## 2020-12-20 ENCOUNTER — Other Ambulatory Visit: Payer: Self-pay | Admitting: Nurse Practitioner

## 2020-12-20 ENCOUNTER — Telehealth (INDEPENDENT_AMBULATORY_CARE_PROVIDER_SITE_OTHER): Payer: 59 | Admitting: Nurse Practitioner

## 2020-12-20 DIAGNOSIS — R0602 Shortness of breath: Secondary | ICD-10-CM

## 2020-12-20 DIAGNOSIS — U071 COVID-19: Secondary | ICD-10-CM | POA: Diagnosis not present

## 2020-12-20 MED ORDER — PREDNISONE 20 MG PO TABS: 20.0000 mg | ORAL_TABLET | Freq: Every day | ORAL | 0 refills | Status: DC

## 2020-12-20 MED ORDER — ALBUTEROL SULFATE HFA 108 (90 BASE) MCG/ACT IN AERS: 2.0000 | INHALATION_SPRAY | Freq: Four times a day (QID) | RESPIRATORY_TRACT | 12 refills | Status: DC | PRN

## 2020-12-20 MED FILL — predniSONE 20 MG TABS: 20 | 5 days supply | Qty: 5 | Fill #0

## 2020-12-20 MED FILL — ALBUTEROL SULFATE HFA 108 (: 108 (90 BAS | 25 days supply | Qty: 9 | Fill #0

## 2020-12-20 NOTE — Patient Instructions (Addendum)
Covid 19 Cough:   Stay well hydrated  Stay active  Deep breathing exercises  May start vitamin C daily, vitamin D3 daily, Zinc daily  May take tylenol or fever or pain  May take mucinex twice daily  Will order prednisone  Will order chest x ray:  [facility] [address] [place], [place] [address] [phone] MON - FRI 8:00 AM - 4:00 PM - WALK IN   Follow up:  Follow up in 3 weeks or sooner if needed

## 2020-12-20 NOTE — Progress Notes (Signed)
Virtual Visit via Telephone Note  I connected with Kathy Hamilton on 12/20/20 at  2:30 PM EST by telephone and verified that I am speaking with the correct person using two identifiers.  Location: Patient: home Provider: remote   I discussed the limitations, risks, security and privacy concerns of performing an evaluation and management service by telephone and the availability of in person appointments. I also discussed with the patient that there may be a patient responsible charge related to this service. The patient expressed understanding and agreed to proceed.  Chief Complaint  Patient presents with  . New Patient (Initial Visit)    COVID +1/14; Sx: chest congestion, cough, headaches and lightheadedness.  Got retested today       History of Present Illness:  Patient presents today for post COVID care clinic through televisit.  Patient tested positive on 12/10/2018.  Patient states that she continues to have chest congestion, cough, headaches.  Patient does have a history of asthma.  Patient has been prescribed Augmentin and Tessalon Perles by PCP.  Patient is trying to stay active.  She is eating well and stay well-hydrated.  She has not had any chest imaging done since diagnosed with Covid. Denies f/c/s, n/v/d, hemoptysis, PND, chest pain or edema.     Observations/Objective:  Vitals with BMI 12/10/2020 12/10/2020 12/10/2020  Height - - -  Weight - 188 lbs 4 oz -  BMI - - -  Systolic 136 - 179  Diastolic 68 - 67  Pulse 78 - 47      Assessment and Plan:  Covid 19 Cough:   Stay well hydrated  Stay active  Deep breathing exercises  May start vitamin C daily, vitamin D3 daily, Zinc daily  May take tylenol or fever or pain  May take mucinex twice daily  Will order prednisone  Will order chest x ray:  [facility] [address] [place], [place] [address] [phone] MON - FRI 8:00 AM - 4:00 PM - WALK IN    Follow Up Instructions:  Follow up in 3  weeks or sooner if needed     I discussed the assessment and treatment plan with the patient. The patient was provided an opportunity to ask questions and all were answered. The patient agreed with the plan and demonstrated an understanding of the instructions.   The patient was advised to call back or seek an in-person evaluation if the symptoms worsen or if the condition fails to improve as anticipated.  I provided 22 minutes of non-face-to-face time during this encounter.   Ivonne Andrew, NP

## 2021-02-10 ENCOUNTER — Other Ambulatory Visit: Payer: Self-pay

## 2021-02-10 ENCOUNTER — Ambulatory Visit
Admission: EM | Admit: 2021-02-10 | Discharge: 2021-02-10 | Disposition: A | Discharge status: Home / Self Care | Payer: 59 | Attending: Family Medicine | Admitting: Family Medicine

## 2021-02-10 DIAGNOSIS — L02511 Cutaneous abscess of right hand: Secondary | ICD-10-CM | POA: Diagnosis not present

## 2021-02-10 DIAGNOSIS — M79644 Pain in right finger(s): Secondary | ICD-10-CM | POA: Diagnosis not present

## 2021-02-10 MED ORDER — CEPHALEXIN 500 MG PO CAPS: 500.0000 mg | ORAL_CAPSULE | Freq: Two times a day (BID) | ORAL | 0 refills | Status: AC

## 2021-02-10 NOTE — Discharge Instructions (Signed)
I have sent in Keflex for you to take twice a day for 5 days  Follow up with this office or with primary care for increased swelling, redness, tenderness, warmth, drainage from the area.  Follow up in the ER for red streaking up your hand, high fever, trouble swallowing, trouble breathing, other concerning symptoms.

## 2021-02-10 NOTE — ED Provider Notes (Signed)
[facility]    CSN: [id number] Arrival date & time: 02/10/21  0947      History   Chief Complaint No chief complaint on file.   HPI Kathy Hamilton is a 55 y.o. female.   Reports that she has an infection on the right thumb. Reports that she has opened the area and expressed purulent drainage from the area. Has not attempted other OTC treatment. Denies previous symptoms. Reports she thinks that she got bit by a spider. Denies headache, cough, fever, nausea, vomiting, diarrhea, rash, fever, other symptoms.  ROS per HPI  The history is provided by the patient.    Past Medical History:  Diagnosis Date  . Bronchitis   . Community acquired pneumonia   . Family history of colon cancer   . H/O tubal ligation 09/2019  . Hypertriglyceridemia   . Premenopausal patient   . Seasonal allergies   . Shortness of breath     Patient Active Problem List   Diagnosis Date Noted  . COVID 12/20/2020  . Shortness of breath 12/20/2020  . Skin rash 10/14/2019  . Skin infection 10/14/2019  . Irregular periods 04/11/2019  . Premenopausal patient 04/11/2019  . Dyspnea on exertion 04/11/2019  . Seasonal allergies 04/11/2019  . Vitamin D deficiency 04/11/2019  . Family history of diabetes mellitus 03/27/2016  . Family history of hypercholesterolemia 03/27/2016    Past Surgical History:  Procedure Laterality Date  . CHOLECYSTECTOMY    . ORTHOPEDIC SURGERY Left 2013   rotator cuff  . TUBAL LIGATION      OB History    Gravida  5   Para      Term      Preterm      AB      Living  4     SAB      IAB      Ectopic      Multiple      Live Births               Home Medications    Prior to Admission medications   Medication Sig Start Date End Date Taking? Authorizing Provider  cephALEXin (KEFLEX) 500 MG capsule Take 1 capsule (500 mg total) by mouth 2 (two) times daily for 5 days. 02/10/21 02/15/21 Yes Moshe Cipro, NP  albuterol (PROVENTIL) (2.5  MG/3ML) 0.083% nebulizer solution Take 3 mLs (2.5 mg total) by nebulization every 6 (six) hours as needed for wheezing or shortness of breath. 08/04/20   Kallie Locks, FNP  albuterol (VENTOLIN HFA) 108 (90 Base) MCG/ACT inhaler Inhale 2 puffs into the lungs every 6 (six) hours as needed for wheezing or shortness of breath. 12/20/20   Ivonne Andrew, NP  benzonatate (TESSALON) 100 MG capsule Take 1 capsule (100 mg total) by mouth 2 (two) times daily as needed for cough. 12/17/20   Kallie Locks, FNP  budesonide-formoterol (SYMBICORT) 80-4.5 MCG/ACT inhaler Inhale 2 puffs into the lungs 2 (two) times daily. 08/04/20   Kallie Locks, FNP  cetirizine (ZYRTEC) 10 MG tablet Take 1 tablet (10 mg total) by mouth daily. 08/04/20   Kallie Locks, FNP  dicyclomine (BENTYL) 20 MG tablet Take 1 tablet (20 mg total) by mouth 2 (two) times daily. 09/03/20   Khatri, Hina, PA-C  gemfibrozil (LOPID) 600 MG tablet Take 1 tablet (600 mg total) by mouth 2 (two) times daily before a meal. 08/04/20   Kallie Locks, FNP  ondansetron (ZOFRAN ODT) 4 MG  disintegrating tablet Take 1 tablet (4 mg total) by mouth every 8 (eight) hours as needed for nausea or vomiting. Patient not taking: Reported on 12/20/2020 09/03/20   Dietrich Pates, PA-C  Vitamin D, Ergocalciferol, (DRISDOL) 1.25 MG (50000 UT) CAPS capsule Take 1 capsule (50,000 Units total) by mouth every 7 (seven) days. 10/13/19   Kallie Locks, FNP    Family History Family History  Problem Relation Age of Onset  . Cancer Mother   . Stroke Mother   . Heart attack Mother   . Colon cancer Mother   . Bipolar disorder Sister   . Breast cancer Sister   . Cervical cancer Sister   . Stroke Brother   . Asthma Daughter   . Bipolar disorder Daughter   . ADD / ADHD Son   . Asperger's syndrome Son     Social History Social History   Tobacco Use  . Smoking status: Former Games developer  . Smokeless tobacco: Never Used  Vaping Use  . Vaping Use: Never used   Substance Use Topics  . Alcohol use: No  . Drug use: No     Allergies   Oxycodone   Review of Systems Review of Systems   Physical Exam Triage Vital Signs ED Triage Vitals  Enc Vitals Group     BP 02/10/21 1038 (S) (!) 168/92     Pulse Rate 02/10/21 1038 70     Resp 02/10/21 1038 16     Temp 02/10/21 1038 98 F (36.7 C)     Temp Source 02/10/21 1038 Temporal     SpO2 02/10/21 1038 97 %     Weight 02/10/21 1033 188 lb (85.3 kg)     Height --      Head Circumference --      Peak Flow --      Pain Score 02/10/21 1032 8     Pain Loc --      Pain Edu? --      Excl. in GC? --    No data found.  Updated Vital Signs BP (S) (!) 168/92 (BP Location: Left Arm)   Pulse 70   Temp 98 F (36.7 C) (Temporal)   Resp 16   Wt 188 lb (85.3 kg)   LMP 01/29/2021   SpO2 97%   BMI 35.52 kg/m   Visual Acuity Right Eye Distance:   Left Eye Distance:   Bilateral Distance:    Right Eye Near:   Left Eye Near:    Bilateral Near:     Physical Exam Vitals and nursing note reviewed.  Constitutional:      General: She is not in acute distress.    Appearance: She is well-developed.  HENT:     Head: Normocephalic and atraumatic.  Eyes:     Conjunctiva/sclera: Conjunctivae normal.  Cardiovascular:     Rate and Rhythm: Normal rate and regular rhythm.     Heart sounds: No murmur heard.   Pulmonary:     Effort: Pulmonary effort is normal. No respiratory distress.     Breath sounds: Normal breath sounds.  Abdominal:     Palpations: Abdomen is soft.     Tenderness: There is no abdominal tenderness.  Musculoskeletal:        General: Normal range of motion.     Cervical back: Neck supple.  Skin:    General: Skin is warm and dry.     Capillary Refill: Capillary refill takes less than 2 seconds.     Findings: Abscess  present.          Comments: 0.5cm diameter area of erythema, tenderness, warmth, no drainage noted  Neurological:     General: No focal deficit present.      Mental Status: She is alert and oriented to person, place, and time.  Psychiatric:        Mood and Affect: Mood normal.        Behavior: Behavior normal.        Thought Content: Thought content normal.      UC Treatments / Results  Labs (all labs ordered are listed, but only abnormal results are displayed) Labs Reviewed - No data to display  EKG   Radiology No results found.  Procedures Incision and Drainage  Date/Time: 02/10/2021 11:00 AM Performed by: Moshe Cipro, NP Authorized by: Moshe Cipro, NP   Consent:    Consent obtained:  Verbal   Consent given by:  Patient   Risks discussed:  Bleeding, incomplete drainage and infection   Alternatives discussed:  Alternative treatment Universal protocol:    Patient identity confirmed:  Verbally with patient and arm band Location:    Type:  Abscess   Size:  0.5   Location:  Upper extremity   Upper extremity location:  Finger   Finger location:  R thumb Pre-procedure details:    Skin preparation:  Antiseptic wash Sedation:    Sedation type:  None Anesthesia:    Anesthesia method:  Local infiltration   Local anesthetic:  Lidocaine 2% w/o epi Procedure type:    Complexity:  Simple Procedure details:    Incision types:  Stab incision   Incision depth:  Subcutaneous   Drainage:  Bloody and purulent   Drainage amount:  Scant   Wound treatment:  Wound left open   Packing materials:  None Post-procedure details:    Procedure completion:  Tolerated well, no immediate complications   (including critical care time)  Medications Ordered in UC Medications - No data to display  Initial Impression / Assessment and Plan / UC Course  I have reviewed the triage vital signs and the nursing notes.  Pertinent labs & imaging results that were available during my care of the patient were reviewed by me and considered in my medical decision making (see chart for details).    Right thumb pain Abscess  I&D in  office as above Prescribed Keflex 500mg  BID x 5 days Follow up with this office or with primary care if symptoms are persisting.  Follow up in the ER for high fever, trouble swallowing, trouble breathing, other concerning symptoms.   Final Clinical Impressions(s) / UC Diagnoses   Final diagnoses:  Cutaneous abscess of right hand  Thumb pain, right     Discharge Instructions     I have sent in Keflex for you to take twice a day for 5 days  Follow up with this office or with primary care for increased swelling, redness, tenderness, warmth, drainage from the area.  Follow up in the ER for red streaking up your hand, high fever, trouble swallowing, trouble breathing, other concerning symptoms.     ED Prescriptions    Medication Sig Dispense Auth. Provider   cephALEXin (KEFLEX) 500 MG capsule Take 1 capsule (500 mg total) by mouth 2 (two) times daily for 5 days. 10 capsule , NP     PDMP not reviewed this encounter.   Moshe Cipro, NP 02/10/21 1251

## 2021-02-10 NOTE — ED Triage Notes (Signed)
Patient presents to Urgent Care with complaints of a spider bite to the right thumb x 3 days ago. She states the swelling to site has decreased but continues to have increased pain with movement to thumb. Treating discomfort with ibuprofen and cleaning site with peroxide.    Denies fever.

## 2021-08-03 ENCOUNTER — Ambulatory Visit: Payer: Self-pay | Admitting: Nurse Practitioner

## 2021-08-03 ENCOUNTER — Ambulatory Visit: Payer: Self-pay | Admitting: Family Medicine

## 2021-08-06 ENCOUNTER — Emergency Department (HOSPITAL_COMMUNITY)
Admission: EM | Admit: 2021-08-06 | Discharge: 2021-08-06 | Disposition: A | Discharge status: Home / Self Care | Payer: 59 | Attending: Emergency Medicine | Admitting: Emergency Medicine

## 2021-08-06 ENCOUNTER — Other Ambulatory Visit: Payer: Self-pay

## 2021-08-06 DIAGNOSIS — Z20822 Contact with and (suspected) exposure to covid-19: Secondary | ICD-10-CM | POA: Diagnosis not present

## 2021-08-06 DIAGNOSIS — Z8616 Personal history of COVID-19: Secondary | ICD-10-CM | POA: Insufficient documentation

## 2021-08-06 DIAGNOSIS — J069 Acute upper respiratory infection, unspecified: Secondary | ICD-10-CM | POA: Diagnosis not present

## 2021-08-06 DIAGNOSIS — R059 Cough, unspecified: Secondary | ICD-10-CM | POA: Diagnosis present

## 2021-08-06 DIAGNOSIS — Z87891 Personal history of nicotine dependence: Secondary | ICD-10-CM | POA: Insufficient documentation

## 2021-08-06 DIAGNOSIS — Z79899 Other long term (current) drug therapy: Secondary | ICD-10-CM | POA: Insufficient documentation

## 2021-08-06 LAB — RESP PANEL BY RT-PCR (FLU A&B, COVID) ARPGX2
Influenza A by PCR: NEGATIVE
Influenza B by PCR: NEGATIVE
SARS Coronavirus 2 by RT PCR: NEGATIVE

## 2021-08-06 MED ORDER — NIRMATRELVIR/RITONAVIR (PAXLOVID)TABLET
3.0000 | ORAL_TABLET | Freq: Two times a day (BID) | ORAL | 0 refills | Status: DC
  Filled 2021-08-06: qty 30, 5d supply, fill #0

## 2021-08-06 MED ORDER — ONDANSETRON 4 MG PO TBDP
ORAL_TABLET | ORAL | 0 refills | Status: AC
  Filled 2021-08-06: qty 20, 3d supply, fill #0

## 2021-08-06 MED ORDER — BENZONATATE 100 MG PO CAPS
100.0000 mg | ORAL_CAPSULE | Freq: Three times a day (TID) | ORAL | 0 refills | Status: AC
  Filled 2021-08-06: qty 21, 7d supply, fill #0

## 2021-08-06 NOTE — Discharge Instructions (Signed)
Take tylenol 2 pills 4 times a day and motrin 4 pills 3 times a day.  Drink plenty of fluids.  Return for worsening shortness of breath, headache, confusion. Follow up with your family doctor.   As we discussed this typically resolves without any intervention from [place].  Cough and nausea medicine are there for your comfort.  I did prescribe you paxlovid in case your test is positive.  If there is some hesitancy then discuss with your family doctor.  If you take this medicine you likely need to stop taking your cholesterol medicine.

## 2021-08-06 NOTE — ED Triage Notes (Signed)
Pt states she was around a coworker who tested positive for covid. Pt reports  cough, body aches, and nausea.

## 2021-08-06 NOTE — ED Provider Notes (Signed)
[facility]-EMERGENCY DEPT Provider Note   CSN: [id number] Arrival date & time: 08/06/21  0830     History Chief Complaint  Patient presents with   Covid Exposure   Cough    Kathy Hamilton is a 55 y.o. female.  55 yo F with a chief complaints of cough congestion fevers chills and nausea.  This been going on for about 48 hours.  She was around a sick contact at work who tested positive for COVID.  She is worried that she has the same.  Has a history of asthma.  Denies abdominal pain.  The history is provided by the patient.  Cough Associated symptoms: chills, fever and myalgias   Associated symptoms: no chest pain, no headaches, no rhinorrhea, no shortness of breath and no wheezing   Illness Severity:  Moderate Onset quality:  Gradual Duration:  2 days Timing:  Constant Progression:  Worsening Chronicity:  New Associated symptoms: cough, fever, myalgias and nausea   Associated symptoms: no chest pain, no congestion, no headaches, no rhinorrhea, no shortness of breath, no vomiting and no wheezing       Past Medical History:  Diagnosis Date   Bronchitis    Community acquired pneumonia    Family history of colon cancer    H/O tubal ligation 09/2019   Hypertriglyceridemia    Premenopausal patient    Seasonal allergies    Shortness of breath     Patient Active Problem List   Diagnosis Date Noted   COVID 12/20/2020   Shortness of breath 12/20/2020   Skin rash 10/14/2019   Skin infection 10/14/2019   Irregular periods 04/11/2019   Premenopausal patient 04/11/2019   Dyspnea on exertion 04/11/2019   Seasonal allergies 04/11/2019   Vitamin D deficiency 04/11/2019   Family history of diabetes mellitus 03/27/2016   Family history of hypercholesterolemia 03/27/2016    Past Surgical History:  Procedure Laterality Date   CHOLECYSTECTOMY     ORTHOPEDIC SURGERY Left 2013   rotator cuff   TUBAL LIGATION       OB History     Gravida  5   Para       Term      Preterm      AB      Living  4      SAB      IAB      Ectopic      Multiple      Live Births              Family History  Problem Relation Age of Onset   Cancer Mother    Stroke Mother    Heart attack Mother    Colon cancer Mother    Bipolar disorder Sister    Breast cancer Sister    Cervical cancer Sister    Stroke Brother    Asthma Daughter    Bipolar disorder Daughter    ADD / ADHD Son    Asperger's syndrome Son     Social History   Tobacco Use   Smoking status: Former   Smokeless tobacco: Never  Building services engineer Use: Never used  Substance Use Topics   Alcohol use: No   Drug use: No    Home Medications Prior to Admission medications   Medication Sig Start Date End Date Taking? Authorizing Provider  benzonatate (TESSALON) 100 MG capsule Take 1 capsule (100 mg total) by mouth every 8 (eight) hours. 08/06/21  Yes Melene Plan,  DO  nirmatrelvir/ritonavir EUA (PAXLOVID) 20 x 150 MG & 10 x 100MG  TABS Take 3 tablets by mouth 2 (two) times daily for 5 days. Take nirmatrelvir (150 mg) two tablets twice daily for 5 days and ritonavir (100 mg) one tablet twice daily for 5 days. 08/06/21 08/11/21 Yes 08/13/21, DO  ondansetron (ZOFRAN ODT) 4 MG disintegrating tablet 4mg  ODT q4 hours prn nausea/vomit 08/06/21  Yes , Anetria Harwick, DO  albuterol (PROVENTIL) (2.5 MG/3ML) 0.083% nebulizer solution Take 3 mLs (2.5 mg total) by nebulization every 6 (six) hours as needed for wheezing or shortness of breath. 08/04/20   Adela Lank, FNP  albuterol (VENTOLIN HFA) 108 (90 Base) MCG/ACT inhaler INHALE 2 PUFFS INTO THE LUNGS EVERY 6 (SIX) HOURS AS NEEDED FOR WHEEZING OR SHORTNESS OF BREATH. 12/20/20 12/20/21  12/22/20, NP  budesonide-formoterol (SYMBICORT) 80-4.5 MCG/ACT inhaler Inhale 2 puffs into the lungs 2 (two) times daily. 08/04/20   Ivonne Andrew, FNP  cetirizine (ZYRTEC) 10 MG tablet Take 1 tablet (10 mg total) by mouth daily. 08/04/20   Kallie Locks, FNP  dicyclomine (BENTYL) 20 MG tablet Take 1 tablet (20 mg total) by mouth 2 (two) times daily. 09/03/20   Khatri, Hina, PA-C  gemfibrozil (LOPID) 600 MG tablet Take 1 tablet (600 mg total) by mouth 2 (two) times daily before a meal. 08/04/20   11/03/20, FNP  predniSONE (DELTASONE) 20 MG tablet TAKE 1 TABLET (20 MG TOTAL) BY MOUTH DAILY WITH BREAKFAST FOR 5 DAYS. 12/20/20 12/20/21  12/22/20, NP  Vitamin D, Ergocalciferol, (DRISDOL) 1.25 MG (50000 UT) CAPS capsule Take 1 capsule (50,000 Units total) by mouth every 7 (seven) days. 10/13/19   Ivonne Andrew, FNP    Allergies    Oxycodone  Review of Systems   Review of Systems  Constitutional:  Positive for chills and fever.  HENT:  Negative for congestion and rhinorrhea.   Eyes:  Negative for redness and visual disturbance.  Respiratory:  Positive for cough. Negative for shortness of breath and wheezing.   Cardiovascular:  Negative for chest pain and palpitations.  Gastrointestinal:  Positive for nausea. Negative for vomiting.  Genitourinary:  Negative for dysuria and urgency.  Musculoskeletal:  Positive for myalgias. Negative for arthralgias.  Skin:  Negative for pallor and wound.  Neurological:  Negative for dizziness and headaches.   Physical Exam Updated Vital Signs BP (!) 165/78   Pulse 76   Temp 98.2 F (36.8 C)   Resp 18   SpO2 100%   Physical Exam Vitals and nursing note reviewed.  Constitutional:      General: She is not in acute distress.    Appearance: She is well-developed. She is not diaphoretic.  HENT:     Head: Normocephalic and atraumatic.  Eyes:     Pupils: Pupils are equal, round, and reactive to light.  Cardiovascular:     Rate and Rhythm: Normal rate and regular rhythm.     Heart sounds: No murmur heard.   No friction rub. No gallop.  Pulmonary:     Effort: Pulmonary effort is normal.     Breath sounds: No wheezing or rales.  Abdominal:     General: There is no distension.      Palpations: Abdomen is soft.     Tenderness: There is no abdominal tenderness.  Musculoskeletal:        General: No tenderness.     Cervical back: Normal range of motion and neck supple.  Skin:    General: Skin is warm and dry.  Neurological:     Mental Status: She is alert and oriented to person, place, and time.  Psychiatric:        Behavior: Behavior normal.    ED Results / Procedures / Treatments   Labs (all labs ordered are listed, but only abnormal results are displayed) Labs Reviewed  RESP PANEL BY RT-PCR (FLU A&B, COVID) ARPGX2    EKG None  Radiology No results found.  Procedures Procedures   Medications Ordered in ED Medications - No data to display  ED Course  I have reviewed the triage vital signs and the nursing notes.  Pertinent labs & imaging results that were available during my care of the patient were reviewed by me and considered in my medical decision making (see chart for details).    MDM Rules/Calculators/A&P                           55 yO F with URI symptoms going on for about 48 hours.  Known close contact with COVID.  Will treat symptomatically.  Have the patient follow-up with her PCP.  With history of asthma I did prescribe COVID specific therapy though cautioned her that she should only take it if it came back positive and she should likely stop her cholesterol medicine while taking it.  9:21 AM:  I have discussed the diagnosis/risks/treatment options with the patient and believe the pt to be eligible for discharge home to follow-up with PCP. We also discussed returning to the ED immediately if new or worsening sx occur. We discussed the sx which are most concerning (e.g., sudden worsening pain, fever, inability to tolerate by mouth) that necessitate immediate return. Medications administered to the patient during their visit and any new prescriptions provided to the patient are listed below.  Medications given during this visit Medications -  No data to display   The patient appears reasonably screen and/or stabilized for discharge and I doubt any other medical condition or other [facility] requiring further screening, evaluation, or treatment in the ED at this time prior to discharge.   Final Clinical Impression(s) / ED Diagnoses Final diagnoses:  Viral URI with cough    Rx / DC Orders ED Discharge Orders          Ordered    benzonatate (TESSALON) 100 MG capsule  Every 8 hours        08/06/21 0907    ondansetron (ZOFRAN ODT) 4 MG disintegrating tablet        08/06/21 0907    nirmatrelvir/ritonavir EUA (PAXLOVID) 20 x 150 MG & 10 x 100MG  TABS  2 times daily        08/06/21 0907             10/06/21, DO 08/06/21 [phone]

## 2021-08-08 ENCOUNTER — Other Ambulatory Visit: Payer: Self-pay

## 2021-08-15 ENCOUNTER — Other Ambulatory Visit: Payer: Self-pay

## 2022-01-18 ENCOUNTER — Emergency Department (HOSPITAL_COMMUNITY): Payer: 59

## 2022-01-18 ENCOUNTER — Emergency Department (HOSPITAL_COMMUNITY)
Admission: EM | Admit: 2022-01-18 | Discharge: 2022-01-18 | Disposition: A | Discharge status: Home / Self Care | Payer: 59 | Attending: Emergency Medicine | Admitting: Emergency Medicine

## 2022-01-18 DIAGNOSIS — R059 Cough, unspecified: Secondary | ICD-10-CM | POA: Diagnosis present

## 2022-01-18 DIAGNOSIS — Z20822 Contact with and (suspected) exposure to covid-19: Secondary | ICD-10-CM | POA: Diagnosis not present

## 2022-01-18 DIAGNOSIS — M791 Myalgia, unspecified site: Secondary | ICD-10-CM | POA: Diagnosis not present

## 2022-01-18 DIAGNOSIS — R079 Chest pain, unspecified: Secondary | ICD-10-CM | POA: Insufficient documentation

## 2022-01-18 DIAGNOSIS — J069 Acute upper respiratory infection, unspecified: Secondary | ICD-10-CM | POA: Diagnosis not present

## 2022-01-18 LAB — RESP PANEL BY RT-PCR (FLU A&B, COVID) ARPGX2
Influenza A by PCR: NEGATIVE
Influenza B by PCR: NEGATIVE
SARS Coronavirus 2 by RT PCR: NEGATIVE

## 2022-01-18 LAB — CBC
HCT: 41.7 % (ref 36.0–46.0)
Hemoglobin: 13.5 g/dL (ref 12.0–15.0)
MCH: 29.2 pg (ref 26.0–34.0)
MCHC: 32.4 g/dL (ref 30.0–36.0)
MCV: 90.3 fL (ref 80.0–100.0)
Platelets: 306 10*3/uL (ref 150–400)
RBC: 4.62 MIL/uL (ref 3.87–5.11)
RDW: 13.2 % (ref 11.5–15.5)
WBC: 12.2 10*3/uL — ABNORMAL HIGH (ref 4.0–10.5)
nRBC: 0 % (ref 0.0–0.2)

## 2022-01-18 LAB — BASIC METABOLIC PANEL
Anion gap: 7 (ref 5–15)
BUN: 16 mg/dL (ref 6–20)
CO2: 26 mmol/L (ref 22–32)
Calcium: 9 mg/dL (ref 8.9–10.3)
Chloride: 104 mmol/L (ref 98–111)
Creatinine, Ser: 0.79 mg/dL (ref 0.44–1.00)
GFR, Estimated: >60 mL/min (ref >60)
Glucose, Bld: 117 mg/dL — ABNORMAL HIGH (ref 70–99)
Potassium: 3.9 mmol/L (ref 3.5–5.1)
Sodium: 137 mmol/L (ref 135–145)

## 2022-01-18 LAB — TROPONIN I (HIGH SENSITIVITY): Troponin I (High Sensitivity): 9 ng/L (ref <18)

## 2022-01-18 MED ORDER — ACETAMINOPHEN 325 MG PO TABS
650.0000 mg | ORAL_TABLET | Freq: Once | ORAL | Status: AC
  Administered 2022-01-18: 650 mg via ORAL
  Filled 2022-01-18: qty 2

## 2022-01-18 MED ORDER — ONDANSETRON 4 MG PO TBDP
4.0000 mg | ORAL_TABLET | Freq: Once | ORAL | Status: AC
  Administered 2022-01-18: 4 mg via ORAL
  Filled 2022-01-18: qty 1

## 2022-01-18 NOTE — ED Provider Notes (Signed)
[facility]-EMERGENCY DEPT Provider Note   CSN: [id number] Arrival date & time: 01/18/22  1447     History  Chief Complaint  Patient presents with   Generalized Body Aches    Kathy Hamilton is a 56 y.o. female.  Patient with no pertinent past medical history presents today with chief complaint of generalized body aches. She states that she has had cough, congestion and rhinorrhea for the past few days as well. States that yesterday she felt as though food got stuck in her throat and she coughed many times over several hours and was unable to remove the food from her throat. States that she continued to cough throughout the night and states that she was finally able to vomit up the food this morning. She has been eating and drinking normally since then without complication. However states that since then she has felt generally terrible with chest pain that is sharp in nature and located on the left side of her chest. She states that it is always present but gets worse with coughing. She denies any shortness of breath. No history of cardiac problems. She has not taken any medications for her symptoms. Patient does not smoke, no IVDU or recreational drug use, no alcohol use.  The history is provided by the patient. No language interpreter was used.      Home Medications Prior to Admission medications   Medication Sig Start Date End Date Taking? Authorizing Provider  albuterol (PROVENTIL) (2.5 MG/3ML) 0.083% nebulizer solution Take 3 mLs (2.5 mg total) by nebulization every 6 (six) hours as needed for wheezing or shortness of breath. 08/04/20   Kallie Locks, FNP  albuterol (VENTOLIN HFA) 108 (90 Base) MCG/ACT inhaler INHALE 2 PUFFS INTO THE LUNGS EVERY 6 (SIX) HOURS AS NEEDED FOR WHEEZING OR SHORTNESS OF BREATH. 12/20/20 12/20/21  Ivonne Andrew, NP  benzonatate (TESSALON) 100 MG capsule Take 1 capsule (100 mg total) by mouth every 8 (eight) hours. 08/06/21   Melene Plan, DO   budesonide-formoterol (SYMBICORT) 80-4.5 MCG/ACT inhaler Inhale 2 puffs into the lungs 2 (two) times daily. 08/04/20   Kallie Locks, FNP  cetirizine (ZYRTEC) 10 MG tablet Take 1 tablet (10 mg total) by mouth daily. 08/04/20   Kallie Locks, FNP  dicyclomine (BENTYL) 20 MG tablet Take 1 tablet (20 mg total) by mouth 2 (two) times daily. 09/03/20   Khatri, Hina, PA-C  gemfibrozil (LOPID) 600 MG tablet Take 1 tablet (600 mg total) by mouth 2 (two) times daily before a meal. 08/04/20   Kallie Locks, FNP  ondansetron (ZOFRAN ODT) 4 MG disintegrating tablet Dissolve 1 tablet by mouth every 4 hours as needed for nausea/vomit 08/06/21   Melene Plan, DO  Vitamin D, Ergocalciferol, (DRISDOL) 1.25 MG (50000 UT) CAPS capsule Take 1 capsule (50,000 Units total) by mouth every 7 (seven) days. 10/13/19   Kallie Locks, FNP      Allergies    Oxycodone    Review of Systems   Review of Systems  Constitutional:  Negative for chills and fever.  HENT:  Positive for congestion and rhinorrhea. Negative for sore throat, trouble swallowing and voice change.   Respiratory:  Positive for cough. Negative for apnea, choking, chest tightness, shortness of breath, wheezing and stridor.   Cardiovascular:  Positive for chest pain. Negative for palpitations and leg swelling.  Gastrointestinal:  Positive for nausea. Negative for abdominal pain, diarrhea and vomiting.  Skin:  Negative for rash.  Neurological:  Negative for dizziness, tremors, seizures, syncope, facial asymmetry, speech difficulty, weakness, light-headedness, numbness and headaches.  All other systems reviewed and are negative.  Physical Exam Updated Vital Signs BP (!) 181/104 (BP Location: Left Arm)    Pulse 87    Temp 99.4 F (37.4 C) (Oral)    Resp 18    Ht 5\' 2"  (1.575 m)    Wt 85.3 kg    SpO2 94%    BMI 34.39 kg/m  Physical Exam Vitals and nursing note reviewed.  Constitutional:      General: She is not in acute distress.    Appearance:  Normal appearance. She is normal weight. She is not ill-appearing, toxic-appearing or diaphoretic.     Comments: Patient resting comfortably in bed in no acute distress  HENT:     Head: Normocephalic and atraumatic.     Nose: Nose normal.     Mouth/Throat:     Mouth: Mucous membranes are moist.  Eyes:     Extraocular Movements: Extraocular movements intact.     Pupils: Pupils are equal, round, and reactive to light.  Cardiovascular:     Rate and Rhythm: Normal rate and regular rhythm.     Pulses: Normal pulses.     Heart sounds: Normal heart sounds.  Pulmonary:     Effort: Pulmonary effort is normal. No respiratory distress.     Breath sounds: Normal breath sounds. No stridor. No wheezing, rhonchi or rales.  Chest:     Chest wall: No tenderness.  Abdominal:     General: Abdomen is flat.     Palpations: Abdomen is soft.     Tenderness: There is no abdominal tenderness.  Musculoskeletal:        General: Normal range of motion.     Cervical back: Normal range of motion and neck supple.     Right lower leg: No edema.     Left lower leg: No edema.  Skin:    General: Skin is warm and dry.     Capillary Refill: Capillary refill takes less than 2 seconds.  Neurological:     General: No focal deficit present.     Mental Status: She is alert.  Psychiatric:        Mood and Affect: Mood normal.        Behavior: Behavior normal.    ED Results / Procedures / Treatments   Labs (all labs ordered are listed, but only abnormal results are displayed) Labs Reviewed  BASIC METABOLIC PANEL - Abnormal; Notable for the following components:      Result Value   Glucose, Bld 117 (*)    All other components within normal limits  CBC - Abnormal; Notable for the following components:   WBC 12.2 (*)    All other components within normal limits  RESP PANEL BY RT-PCR (FLU A&B, COVID) ARPGX2  TROPONIN I (HIGH SENSITIVITY)    EKG EKG Interpretation  Date/Time:  Wednesday January 18 2022  16:41:12 EST Ventricular Rate:  86 PR Interval:  153 QRS Duration: 85 QT Interval:  321 QTC Calculation: 384 R Axis:   70 Text Interpretation: Sinus tachycardia Ventricular trigeminy Borderline repolarization abnormality Poor data quality in current ECG precludes serial comparison Confirmed by [id number] [phone]) on 01/18/2022 6:00:40 PM  Radiology DG Chest Portable 1 View  Result Date: 01/18/2022 CLINICAL DATA:  Cough, chest pain EXAM: PORTABLE CHEST 1 VIEW COMPARISON:  04/06/2018 FINDINGS: The heart size and mediastinal contours are within normal limits. Both lungs are  clear. The visualized skeletal structures are unremarkable. IMPRESSION: No active disease. Electronically Signed   By: Ernie Avena M.D.   On: 01/18/2022 16:59    Procedures Procedures    Medications Ordered in ED Medications  acetaminophen (TYLENOL) tablet 650 mg (has no administration in time range)  ondansetron (ZOFRAN-ODT) disintegrating tablet 4 mg (4 mg Oral Given 01/18/22 1636)    ED Course/ Medical Decision Making/ A&P                           Medical Decision Making Amount and/or Complexity of Data Reviewed Labs: ordered. Radiology: ordered.  Risk OTC drugs. Prescription drug management.   This patient presents to the ED for concern of generalized bodyaches, this involves an extensive number of treatment options, and is a complaint that carries with it a high risk of complications and morbidity.    Co morbidities that complicate the patient evaluation  none   Lab Tests:  I Ordered, and personally interpreted labs.  The pertinent results include:  COVID/flu negative, troponin negative, no electrolyte abnormalities or changes in kidney function. WBC 12.2, no anemia   Imaging Studies ordered:  I ordered imaging studies including CXR  I independently visualized and interpreted imaging which showed no acute abnormalities I agree with the radiologist interpretation   Cardiac  Monitoring:  The patient was maintained on a cardiac monitor.  I personally viewed and interpreted the cardiac monitored which showed an underlying rhythm of: sinus rhythm   Medicines ordered and prescription drug management:  I ordered medication including tylenol and zofran  for pain and nausea  Reevaluation of the patient after these medicines showed that the patient improved I have reviewed the patients home medicines and have made adjustments as needed   Reevaluation:  After the interventions noted above, I reevaluated the patient and found that they have :improved   Dispostion:  After consideration of the diagnostic results and the patients response to treatment, I feel that the patent would benefit from outpatient management with close follow-up.  Patient presents today with chest pain and generalized bodyaches after feeling like food was stuck in her throat which she was able to clear prior to arrival. Upon my exam, patient is afebrile, nontoxic-appearing, and in no acute distress with reassuring vital signs.  Her lungs sounds are present and clear to auscultation in all fields.  CXR negative, low suspicion for aspiration, pneumonia, pneumothorax, or other acute lung pathology at this time. She is able to drink water normally without emesis. Chest pain work-up negative, Heart score 2, low risk. After Tylenol patient states that she feels some better.  In the presence of unremarkable work-up and patient with cough, congestion, rhinorrhea, and generalized body aches, suspect patient's symptoms are due to a URI, likely viral etiology. Discussed that antibiotics are not indicated for viral infections. Pt will be discharged with symptomatic treatment.  Verbalizes understanding and is agreeable with plan. Pt is hemodynamically stable & in NAD prior to dc.  Educated on red flag symptoms of prompt immediate return, discharged in stable condition.   Final Clinical Impression(s) / ED  Diagnoses Final diagnoses:  Viral upper respiratory tract infection    Rx / DC Orders ED Discharge Orders     None      An After Visit Summary was printed and given to the patient.    Vear Clock 01/18/22 2309    Pricilla Loveless, MD 01/19/22 [phone]

## 2022-01-18 NOTE — Discharge Instructions (Signed)
As we discussed, your work-up today was reassuring for acute abnormalities. Please follow-up with your PCP in the next few days for continued evaluation and management.  Return if development of any new or worsening symptoms.

## 2022-01-18 NOTE — ED Triage Notes (Signed)
Pt states last night she felt like she had food stuck in her throat. She did cough up the food today. Pt states today she is having body aches and chills.

## 2022-04-21 IMAGING — DX DG CHEST 1V PORT
1 series · 1 of 1 positions shown · non-contrast
Comparison: 04/06/2018

CLINICAL DATA: Cough, chest pain

EXAM:
PORTABLE CHEST 1 VIEW

[chest ap]
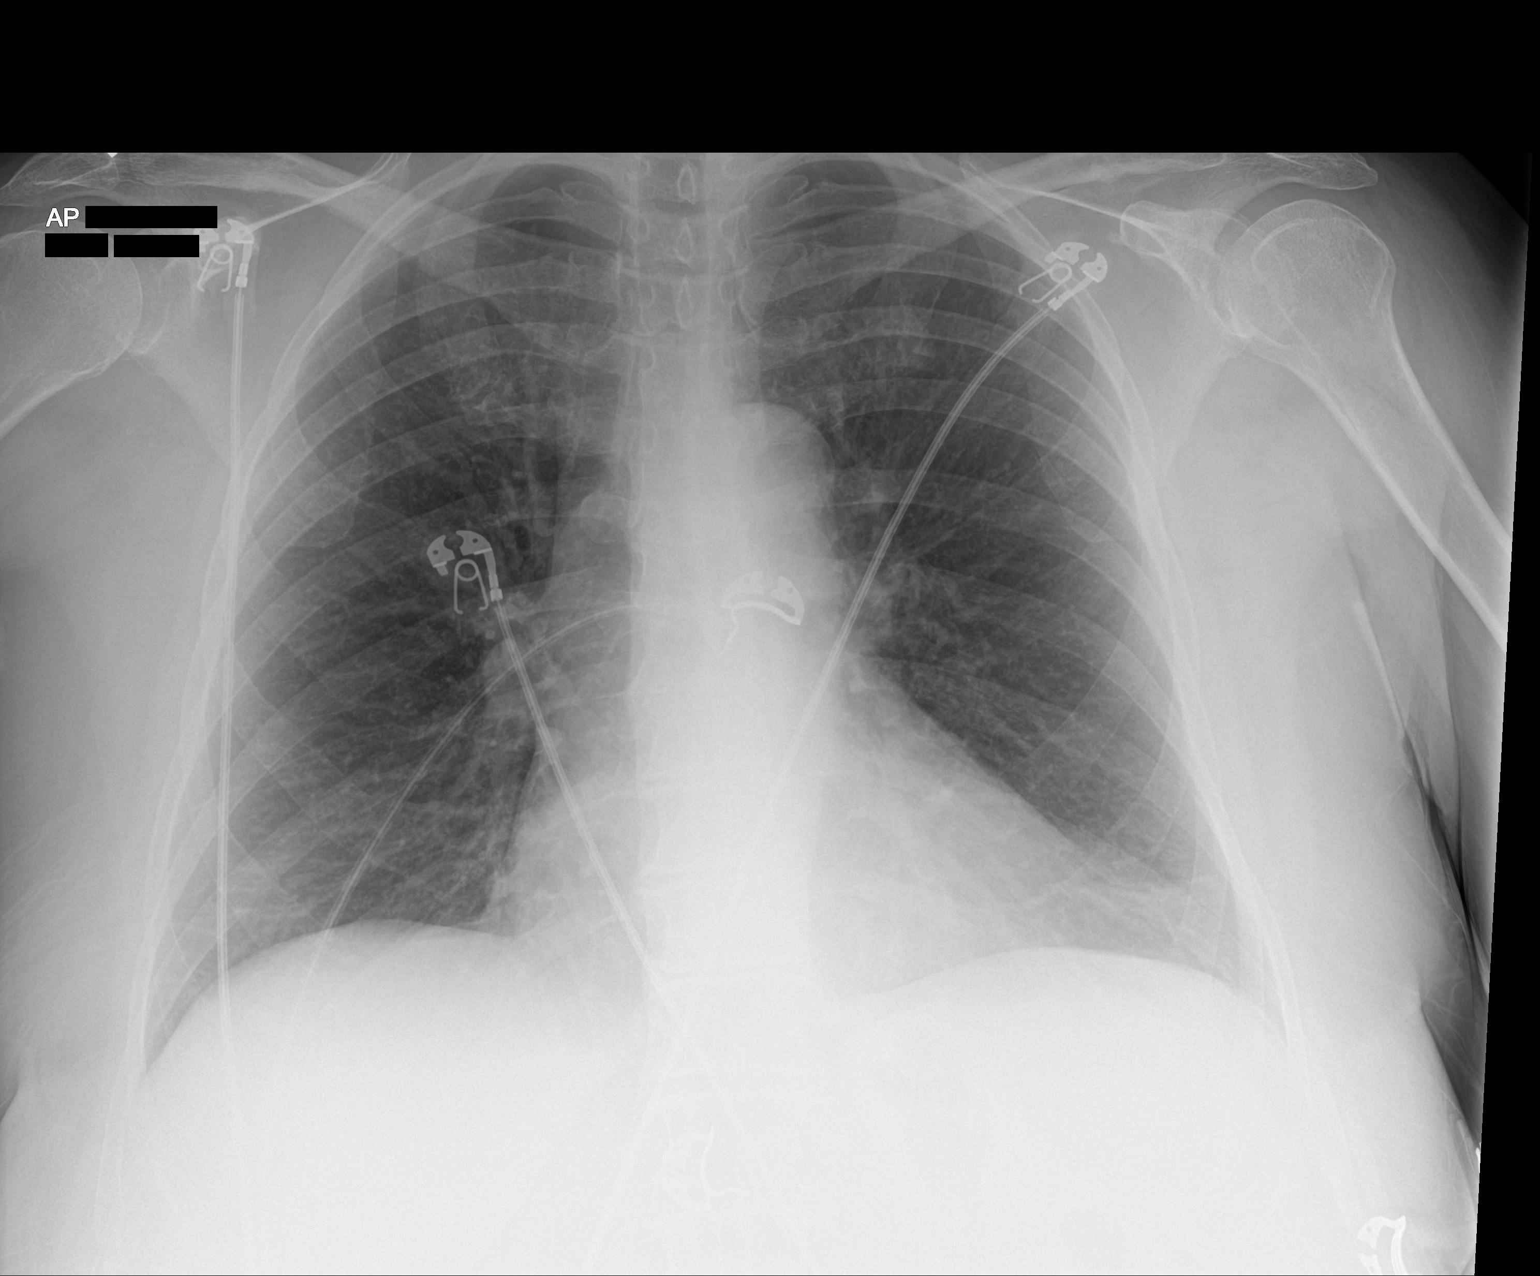

[1 of 1 positions shown; findings below may reference images not displayed]

FINDINGS: The heart size and mediastinal contours are within normal limits.
Both lungs are clear. The visualized skeletal structures are
unremarkable.
IMPRESSION: No active disease.

## 2022-09-13 ENCOUNTER — Ambulatory Visit
Admission: EM | Admit: 2022-09-13 | Discharge: 2022-09-13 | Disposition: A | Discharge status: Home / Self Care | Payer: Commercial Managed Care - HMO | Attending: Physician Assistant | Admitting: Physician Assistant

## 2022-09-13 DIAGNOSIS — Z1152 Encounter for screening for COVID-19: Secondary | ICD-10-CM | POA: Insufficient documentation

## 2022-09-13 DIAGNOSIS — J069 Acute upper respiratory infection, unspecified: Secondary | ICD-10-CM | POA: Diagnosis present

## 2022-09-13 HISTORY — DX: Unspecified asthma, uncomplicated: J45.909

## 2022-09-13 LAB — RESP PANEL BY RT-PCR (FLU A&B, COVID) ARPGX2
Influenza A by PCR: NEGATIVE
Influenza B by PCR: NEGATIVE
SARS Coronavirus 2 by RT PCR: NEGATIVE

## 2022-09-13 NOTE — ED Triage Notes (Signed)
Pt c/o body aches, chills, cough, diarrhea, headache, malaise, nasal congestion onset ~ 2 days ago.

## 2022-09-13 NOTE — ED Provider Notes (Signed)
[facility]    CSN: [id number] Arrival date & time: 09/13/22  1719      History   Chief Complaint Chief Complaint  Patient presents with   Generalized Body Aches    HPI Kathy Hamilton is a 56 y.o. female.   Patient here today for evaluation of generalized body aches, sore throat, cough and nasal congestion that started a few days ago.  She reports that she has had fever with Tmax of 101 last night.  She denies any vomiting but has had diarrhea.  She has tried over-the-counter medication without significant relief.  The history is provided by the patient.    Past Medical History:  Diagnosis Date   Asthma    Bronchitis    Community acquired pneumonia    Family history of colon cancer    H/O tubal ligation 09/2019   Hypertriglyceridemia    Premenopausal patient    Seasonal allergies    Shortness of breath     Patient Active Problem List   Diagnosis Date Noted   COVID 12/20/2020   Shortness of breath 12/20/2020   Skin rash 10/14/2019   Skin infection 10/14/2019   Irregular periods 04/11/2019   Premenopausal patient 04/11/2019   Dyspnea on exertion 04/11/2019   Seasonal allergies 04/11/2019   Vitamin D deficiency 04/11/2019   Family history of diabetes mellitus 03/27/2016   Family history of hypercholesterolemia 03/27/2016    Past Surgical History:  Procedure Laterality Date   CHOLECYSTECTOMY     ORTHOPEDIC SURGERY Left 2013   rotator cuff   TUBAL LIGATION      OB History     Gravida  5   Para      Term      Preterm      AB      Living  4      SAB      IAB      Ectopic      Multiple      Live Births               Home Medications    Prior to Admission medications   Medication Sig Start Date End Date Taking? Authorizing Provider  albuterol (PROVENTIL) (2.5 MG/3ML) 0.083% nebulizer solution Take 3 mLs (2.5 mg total) by nebulization every 6 (six) hours as needed for wheezing or shortness of breath. 08/04/20   Azzie Glatter, FNP  albuterol (VENTOLIN HFA) 108 (90 Base) MCG/ACT inhaler INHALE 2 PUFFS INTO THE LUNGS EVERY 6 (SIX) HOURS AS NEEDED FOR WHEEZING OR SHORTNESS OF BREATH. 12/20/20 12/20/21  Fenton Foy, NP  benzonatate (TESSALON) 100 MG capsule Take 1 capsule (100 mg total) by mouth every 8 (eight) hours. 08/06/21   Deno Etienne, DO  budesonide-formoterol (SYMBICORT) 80-4.5 MCG/ACT inhaler Inhale 2 puffs into the lungs 2 (two) times daily. 08/04/20   Azzie Glatter, FNP  cetirizine (ZYRTEC) 10 MG tablet Take 1 tablet (10 mg total) by mouth daily. 08/04/20   Azzie Glatter, FNP  dicyclomine (BENTYL) 20 MG tablet Take 1 tablet (20 mg total) by mouth 2 (two) times daily. 09/03/20   Khatri, Hina, PA-C  gemfibrozil (LOPID) 600 MG tablet Take 1 tablet (600 mg total) by mouth 2 (two) times daily before a meal. 08/04/20   Azzie Glatter, FNP  ondansetron (ZOFRAN ODT) 4 MG disintegrating tablet Dissolve 1 tablet by mouth every 4 hours as needed for nausea/vomit 08/06/21   Deno Etienne, DO  Vitamin D, Ergocalciferol, (  DRISDOL) 1.25 MG (50000 UT) CAPS capsule Take 1 capsule (50,000 Units total) by mouth every 7 (seven) days. 10/13/19   Azzie Glatter, FNP    Family History Family History  Problem Relation Age of Onset   Cancer Mother    Stroke Mother    Heart attack Mother    Colon cancer Mother    Bipolar disorder Sister    Breast cancer Sister    Cervical cancer Sister    Stroke Brother    Asthma Daughter    Bipolar disorder Daughter    ADD / ADHD Son    Asperger's syndrome Son     Social History Social History   Tobacco Use   Smoking status: Former   Smokeless tobacco: Never  Scientific laboratory technician Use: Never used  Substance Use Topics   Alcohol use: No   Drug use: No     Allergies   Oxycodone   Review of Systems Review of Systems  Constitutional:  Positive for fatigue and fever.  HENT:  Positive for congestion and sore throat. Negative for ear pain.   Eyes:  Negative for  discharge and redness.  Respiratory:  Positive for cough. Negative for shortness of breath and wheezing.   Gastrointestinal:  Positive for diarrhea. Negative for nausea and vomiting.  Neurological:  Positive for headaches.     Physical Exam Triage Vital Signs ED Triage Vitals [09/13/22 1753]  Enc Vitals Group     BP (!) 174/95     Pulse Rate 75     Resp 16     Temp 98.5 F (36.9 C)     Temp Source Oral     SpO2 95 %     Weight      Height      Head Circumference      Peak Flow      Pain Score 8     Pain Loc      Pain Edu?      Excl. in [place]?    No data found.  Updated Vital Signs BP (!) 174/95 (BP Location: Left Arm)   Pulse 75   Temp 98.5 F (36.9 C) (Oral)   Resp 16   SpO2 95%   Physical Exam Vitals and nursing note reviewed.  Constitutional:      General: She is not in acute distress.    Appearance: Normal appearance. She is not ill-appearing.  HENT:     Head: Normocephalic and atraumatic.     Nose: Congestion present.     Mouth/Throat:     Mouth: Mucous membranes are moist.     Pharynx: Posterior oropharyngeal erythema present. No oropharyngeal exudate.  Eyes:     Conjunctiva/sclera: Conjunctivae normal.  Cardiovascular:     Rate and Rhythm: Normal rate and regular rhythm.     Heart sounds: Normal heart sounds. No murmur heard. Pulmonary:     Effort: Pulmonary effort is normal. No respiratory distress.     Breath sounds: Normal breath sounds. No wheezing, rhonchi or rales.  Skin:    General: Skin is warm and dry.  Neurological:     Mental Status: She is alert.  Psychiatric:        Mood and Affect: Mood normal.        Thought Content: Thought content normal.      UC Treatments / Results  Labs (all labs ordered are listed, but only abnormal results are displayed) Labs Reviewed  RESP PANEL BY RT-PCR (FLU A&B, COVID)  ARPGX2    EKG   Radiology No results found.  Procedures Procedures (including critical care time)  Medications Ordered in  UC Medications - No data to display  Initial Impression / Assessment and Plan / UC Course  I have reviewed the triage vital signs and the nursing notes.  Pertinent labs & imaging results that were available during my care of the patient were reviewed by me and considered in my medical decision making (see chart for details).    Suspect viral etiology of symptoms.  Will order screening for COVID and flu.  Recommend follow-up with any further concerns.  Encouraged symptomatic treatment and increased fluids while awaiting results.  Final Clinical Impressions(s) / UC Diagnoses   Final diagnoses:  Encounter for screening for COVID-19  Acute upper respiratory infection   Discharge Instructions   None    ED Prescriptions   None    PDMP not reviewed this encounter.   Francene Finders, PA-C 09/13/22 1831

## 2023-04-18 ENCOUNTER — Emergency Department (HOSPITAL_COMMUNITY)
Admission: EM | Admit: 2023-04-18 | Discharge: 2023-04-19 | Disposition: A | Discharge status: Home / Self Care | Payer: Self-pay | Attending: Emergency Medicine | Admitting: Emergency Medicine

## 2023-04-18 ENCOUNTER — Other Ambulatory Visit: Payer: Self-pay

## 2023-04-18 ENCOUNTER — Emergency Department (HOSPITAL_COMMUNITY): Payer: Self-pay

## 2023-04-18 ENCOUNTER — Encounter (HOSPITAL_COMMUNITY): Payer: Self-pay

## 2023-04-18 DIAGNOSIS — S29019A Strain of muscle and tendon of unspecified wall of thorax, initial encounter: Secondary | ICD-10-CM

## 2023-04-18 DIAGNOSIS — Z7982 Long term (current) use of aspirin: Secondary | ICD-10-CM | POA: Insufficient documentation

## 2023-04-18 DIAGNOSIS — N812 Incomplete uterovaginal prolapse: Secondary | ICD-10-CM

## 2023-04-18 DIAGNOSIS — Z7951 Long term (current) use of inhaled steroids: Secondary | ICD-10-CM | POA: Insufficient documentation

## 2023-04-18 DIAGNOSIS — R600 Localized edema: Secondary | ICD-10-CM | POA: Insufficient documentation

## 2023-04-18 DIAGNOSIS — Y990 Civilian activity done for income or pay: Secondary | ICD-10-CM | POA: Insufficient documentation

## 2023-04-18 DIAGNOSIS — N8185 Cervical stump prolapse: Secondary | ICD-10-CM | POA: Insufficient documentation

## 2023-04-18 DIAGNOSIS — S29012A Strain of muscle and tendon of back wall of thorax, initial encounter: Secondary | ICD-10-CM | POA: Insufficient documentation

## 2023-04-18 DIAGNOSIS — R079 Chest pain, unspecified: Secondary | ICD-10-CM | POA: Insufficient documentation

## 2023-04-18 DIAGNOSIS — X58XXXA Exposure to other specified factors, initial encounter: Secondary | ICD-10-CM | POA: Insufficient documentation

## 2023-04-18 DIAGNOSIS — J45909 Unspecified asthma, uncomplicated: Secondary | ICD-10-CM | POA: Insufficient documentation

## 2023-04-18 LAB — COMPREHENSIVE METABOLIC PANEL
ALT: 16 U/L (ref 0–44)
AST: 18 U/L (ref 15–41)
Albumin: 3.7 g/dL (ref 3.5–5.0)
Alkaline Phosphatase: 67 U/L (ref 38–126)
Anion gap: 7 (ref 5–15)
BUN: 12 mg/dL (ref 6–20)
CO2: 22 mmol/L (ref 22–32)
Calcium: 8 mg/dL — ABNORMAL LOW (ref 8.9–10.3)
Chloride: 110 mmol/L (ref 98–111)
Creatinine, Ser: 0.62 mg/dL (ref 0.44–1.00)
GFR, Estimated: >60 mL/min (ref >60)
Glucose, Bld: 102 mg/dL — ABNORMAL HIGH (ref 70–99)
Potassium: 3.3 mmol/L — ABNORMAL LOW (ref 3.5–5.1)
Sodium: 139 mmol/L (ref 135–145)
Total Bilirubin: 0.5 mg/dL (ref 0.3–1.2)
Total Protein: 6.7 g/dL (ref 6.5–8.1)

## 2023-04-18 LAB — URINALYSIS, ROUTINE W REFLEX MICROSCOPIC
Bilirubin Urine: NEGATIVE
Glucose, UA: NEGATIVE mg/dL
Hgb urine dipstick: NEGATIVE
Ketones, ur: 5 mg/dL — AB
Leukocytes,Ua: NEGATIVE
Nitrite: NEGATIVE
Protein, ur: NEGATIVE mg/dL
Specific Gravity, Urine: 1.023 (ref 1.005–1.030)
pH: 5 (ref 5.0–8.0)

## 2023-04-18 LAB — I-STAT BETA HCG BLOOD, ED (MC, WL, AP ONLY): I-stat hCG, quantitative: <5 m[IU]/mL (ref <5)

## 2023-04-18 LAB — TROPONIN I (HIGH SENSITIVITY)
Troponin I (High Sensitivity): 7 ng/L (ref <18)
Troponin I (High Sensitivity): 9 ng/L (ref <18)

## 2023-04-18 LAB — LIPASE, BLOOD: Lipase: 30 U/L (ref 11–51)

## 2023-04-18 MED ORDER — SODIUM CHLORIDE 0.9 % IV SOLN: 1000.0000 mL | Freq: Once | INTRAVENOUS | Status: DC

## 2023-04-18 MED ORDER — ASPIRIN 81 MG PO TBEC
81.0000 mg | DELAYED_RELEASE_TABLET | Freq: Every day | ORAL | 12 refills | Status: AC
  Filled 2023-04-18 – 2023-04-19 (×2): qty 30, 30d supply, fill #0

## 2023-04-18 MED ORDER — SODIUM CHLORIDE 0.9 % IV BOLUS
1000.0000 mL | Freq: Once | INTRAVENOUS | Status: AC
  Administered 2023-04-18: 1000 mL via INTRAVENOUS

## 2023-04-18 MED ORDER — NAPROXEN 375 MG PO TABS
375.0000 mg | ORAL_TABLET | Freq: Two times a day (BID) | ORAL | 0 refills | Status: DC
  Filled 2023-04-18 – 2023-04-19 (×2): qty 28, 14d supply, fill #0

## 2023-04-18 MED ORDER — IOHEXOL 350 MG/ML SOLN
100.0000 mL | Freq: Once | INTRAVENOUS | Status: AC | PRN
  Administered 2023-04-18: 100 mL via INTRAVENOUS

## 2023-04-18 NOTE — ED Provider Notes (Addendum)
EMERGENCY DEPARTMENT AT [facility] Provider Note   CSN: [id number] Arrival date & time: 04/18/23  1512     History  Chief Complaint  Patient presents with   Back Pain   Headache    Kathy Hamilton is a 57 y.o. female with a history of asthma and hypertriglyceridemia who presents to the ED today for pain in her back and abdominal pain. Patient reports that she was lifting something heavy at work about 2 weeks ago when her back started hurting.  She denies tenderness to the touch but admits to pain with movement of her back.  Similarly, she has had abdominal pain for the past several weeks.  She reports a history of constipation for which she takes Colace with some relief. She reports one bowel movement with straining this morning. No dark/tarry or bright red blood in stool. Denies difficulty or pain with urination. But she does admit that she can feel her cervix and think its is prolapsed. She also complains of chest pain intermittently for the past 2-3 months. Patient reports that the pain is worse when she takes a deep breath and is associated with shortness of breath. She reports that she has not seen a doctor in several years.      Home Medications Prior to Admission medications   Medication Sig Start Date End Date Taking? Authorizing Provider  aspirin EC 81 MG tablet Take 1 tablet (81 mg total) by mouth daily. Swallow whole. 04/18/23  Yes Aberman, Merla Riches, PA-C  naproxen (NAPROSYN) 375 MG tablet Take 1 tablet (375 mg total) by mouth 2 (two) times daily for 14 days. 04/18/23 05/02/23 Yes Maxwell Marion, PA-C  albuterol (PROVENTIL) (2.5 MG/3ML) 0.083% nebulizer solution Take 3 mLs (2.5 mg total) by nebulization every 6 (six) hours as needed for wheezing or shortness of breath. 08/04/20   Kallie Locks, FNP  albuterol (VENTOLIN HFA) 108 (90 Base) MCG/ACT inhaler INHALE 2 PUFFS INTO THE LUNGS EVERY 6 (SIX) HOURS AS NEEDED FOR WHEEZING OR SHORTNESS OF BREATH. 12/20/20  12/20/21  Ivonne Andrew, NP  benzonatate (TESSALON) 100 MG capsule Take 1 capsule (100 mg total) by mouth every 8 (eight) hours. 08/06/21   Melene Plan, DO  budesonide-formoterol (SYMBICORT) 80-4.5 MCG/ACT inhaler Inhale 2 puffs into the lungs 2 (two) times daily. 08/04/20   Kallie Locks, FNP  cetirizine (ZYRTEC) 10 MG tablet Take 1 tablet (10 mg total) by mouth daily. 08/04/20   Kallie Locks, FNP  dicyclomine (BENTYL) 20 MG tablet Take 1 tablet (20 mg total) by mouth 2 (two) times daily. 09/03/20   Khatri, Hina, PA-C  gemfibrozil (LOPID) 600 MG tablet Take 1 tablet (600 mg total) by mouth 2 (two) times daily before a meal. 08/04/20   Kallie Locks, FNP  ondansetron (ZOFRAN ODT) 4 MG disintegrating tablet Dissolve 1 tablet by mouth every 4 hours as needed for nausea/vomit 08/06/21   Melene Plan, DO  Vitamin D, Ergocalciferol, (DRISDOL) 1.25 MG (50000 UT) CAPS capsule Take 1 capsule (50,000 Units total) by mouth every 7 (seven) days. 10/13/19   Kallie Locks, FNP      Allergies    Oxycodone    Review of Systems   Review of Systems  Cardiovascular:  Positive for chest pain.  Gastrointestinal:  Positive for abdominal pain.  Musculoskeletal:  Positive for back pain.  All other systems reviewed and are negative.   Physical Exam Updated Vital Signs BP (!) 151/94   Pulse 79  Temp 98.1 F (36.7 C) (Oral)   Resp 12   Wt 85 kg   SpO2 97%   BMI 34.27 kg/m  Physical Exam Vitals and nursing note reviewed. Exam conducted with a chaperone present.  Constitutional:      Appearance: Normal appearance.  HENT:     Head: Normocephalic and atraumatic.     Mouth/Throat:     Mouth: Mucous membranes are moist.  Eyes:     Conjunctiva/sclera: Conjunctivae normal.     Pupils: Pupils are equal, round, and reactive to light.  Cardiovascular:     Rate and Rhythm: Normal rate and regular rhythm.     Pulses: Normal pulses.     Heart sounds: Normal heart sounds.  Pulmonary:     Effort:  Pulmonary effort is normal.     Breath sounds: Normal breath sounds.  Abdominal:     Palpations: Abdomen is soft.     Tenderness: There is abdominal tenderness.  Genitourinary:    Vagina: Prolapsed vaginal walls present.  Musculoskeletal:        General: No tenderness.     Right lower leg: Edema present.     Left lower leg: Edema present.  Skin:    General: Skin is warm and dry.     Findings: No rash.  Neurological:     General: No focal deficit present.     Mental Status: She is alert.  Psychiatric:        Mood and Affect: Mood normal.        Behavior: Behavior normal.     ED Results / Procedures / Treatments   Labs (all labs ordered are listed, but only abnormal results are displayed) Labs Reviewed  URINALYSIS, ROUTINE W REFLEX MICROSCOPIC - Abnormal; Notable for the following components:      Result Value   APPearance CLOUDY (*)    Ketones, ur 5 (*)    All other components within normal limits  COMPREHENSIVE METABOLIC PANEL - Abnormal; Notable for the following components:   Potassium 3.3 (*)    Glucose, Bld 102 (*)    Calcium 8.0 (*)    All other components within normal limits  LIPASE, BLOOD  CBC WITH DIFFERENTIAL/PLATELET  CBC WITH DIFFERENTIAL/PLATELET  I-STAT BETA HCG BLOOD, ED ([facility], [facility], [facility] ONLY)  TROPONIN I (HIGH SENSITIVITY)  TROPONIN I (HIGH SENSITIVITY)    EKG None  Radiology CT T-SPINE NO CHARGE  Result Date: 04/18/2023 CLINICAL DATA:  Mid back pain radiating to upper back. EXAM: CT THORACIC SPINE WITHOUT CONTRAST TECHNIQUE: Multidetector CT images of the thoracic were obtained using the standard protocol without intravenous contrast. RADIATION DOSE REDUCTION: This exam was performed according to the departmental dose-optimization program which includes automated exposure control, adjustment of the mA and/or kV according to patient size and/or use of iterative reconstruction technique. COMPARISON:  None Available. FINDINGS: Alignment: Normal Vertebrae:  No acute fracture or focal pathologic process. Paraspinal and other soft tissues: Negative. Visualized lungs clear. Disc levels: Degenerative spurring anteriorly and laterally throughout the mid and lower thoracic spine. IMPRESSION: No acute bony abnormality.  Mild degenerative spurring. Electronically Signed   By: Charlett Nose M.D.   On: 04/18/2023 20:21   CT Angio Chest PE W and/or Wo Contrast  Result Date: 04/18/2023 CLINICAL DATA:  Mid back pain and lower extremity edema. EXAM: CT ANGIOGRAPHY CHEST WITH CONTRAST TECHNIQUE: Multidetector CT imaging of the chest was performed using the standard protocol during bolus administration of intravenous contrast. Multiplanar CT image reconstructions and MIPs  were obtained to evaluate the vascular anatomy. RADIATION DOSE REDUCTION: This exam was performed according to the departmental dose-optimization program which includes automated exposure control, adjustment of the mA and/or kV according to patient size and/or use of iterative reconstruction technique. CONTRAST:  OMNIPAQUE IOHEXOL 350 MG/ML SOLN COMPARISON:  None Available. FINDINGS: Cardiovascular: The pulmonary arteries are adequately opacified. There is no evidence of pulmonary embolism. Central pulmonary arteries are of normal caliber. The thoracic aorta is normal in caliber. No significant aortic atherosclerosis. The heart size is normal. No pericardial fluid identified. There is some calcified coronary artery plaque in the distribution of the LAD. Mediastinum/Nodes: No enlarged mediastinal, hilar, or axillary lymph nodes. Thyroid gland, trachea, and esophagus demonstrate no significant findings. Probable tiny hiatal hernia. Lungs/Pleura: There is no evidence of pulmonary edema, consolidation, pneumothorax, nodule or pleural fluid. Upper Abdomen: No acute abnormality. Musculoskeletal: No chest wall abnormality. No acute or significant osseous findings. Review of the MIP images confirms the above  findings. IMPRESSION: 1. No evidence of pulmonary embolism or other acute findings in the chest. 2. Coronary atherosclerosis with calcified coronary artery plaque in the distribution of the LAD. 3. Probable tiny hiatal hernia. Electronically Signed   By: Irish Lack M.D.   On: 04/18/2023 19:18   CT ABDOMEN PELVIS W CONTRAST  Result Date: 04/18/2023 CLINICAL DATA:  Abdominal pain radiating into upper back with constipation and lower extremity edema. EXAM: CT ABDOMEN AND PELVIS WITH CONTRAST TECHNIQUE: Multidetector CT imaging of the abdomen and pelvis was performed using the standard protocol following bolus administration of intravenous contrast. RADIATION DOSE REDUCTION: This exam was performed according to the departmental dose-optimization program which includes automated exposure control, adjustment of the mA and/or kV according to patient size and/or use of iterative reconstruction technique. CONTRAST:  OMNIPAQUE IOHEXOL 350 MG/ML SOLN COMPARISON:  CT of the abdomen and pelvis on 03/20/2020 FINDINGS: Lower chest: No acute abnormality. Hepatobiliary: No focal liver abnormality is seen. Status post cholecystectomy. No biliary dilatation. Pancreas: Unremarkable. No pancreatic ductal dilatation or surrounding inflammatory changes. Spleen: Normal in size without focal abnormality. Adrenals/Urinary Tract: Adrenal glands are unremarkable. Kidneys are normal, without renal calculi, focal lesion, or hydronephrosis. Bladder is unremarkable. Stomach/Bowel: Bowel shows no evidence of obstruction, ileus or acute inflammation. At the level of diverticulosis of the sigmoid colon and prior acute diverticulitis, there is appearance suggestive of chronic wall thickening of the sigmoid colon without acute inflammation. The appendix is normal. No free intraperitoneal air. 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 There is a small rim of mural thrombus within the aneurysm. No evidence of aneurysm rupture. Aneurysm involving the distal splenic artery at the splenic hilum is rim calcified and measures approximately 1.4 x 1.1 x 1.1 cm. No evidence of aneurysm rupture. No evidence of abdominal aortic aneurysm. Iliac and common femoral arteries demonstrate normal patency bilaterally without aneurysm. Reproductive: Uterus and bilateral adnexa are unremarkable. Other: No abdominal wall hernia or abnormality. No abdominopelvic ascites. Musculoskeletal: No acute or significant osseous findings. IMPRESSION: 1. No acute findings in the abdomen or pelvis. 2. 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 This is more clearly delineated compared to the prior studies in 2021 and measures approximately 1.5 x 1.1 x 1.1 cm. There is a small rim of mural thrombus within the aneurysm. No evidence of aneurysm rupture.  3. 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 No evidence of aneurysm rupture. 4. Both the right renal artery and splenic artery aneurysms demonstrate no significant growth since 2021 but the right renal artery aneurysm may be minimally larger. Recommend follow-up with CTA of the abdomen every 1-2 years. 5. At the level of diverticulosis of the sigmoid colon and prior acute diverticulitis, there is appearance suggestive of chronic wall thickening of the sigmoid colon without acute inflammation. This is likely on the basis of chronic inflammation from prior diverticulitis. However, if the patient has not had prior colonoscopy, follow-up colonoscopy would be appropriate at some point to evaluate the colon and exclude neoplasm. Electronically Signed   By: Irish Lack M.D.   On: 04/18/2023 19:14   DG Chest Portable 1 View  Result Date: 04/18/2023 CLINICAL DATA:  Short of breath, mid  back pain, lower extremity edema EXAM: PORTABLE CHEST 1 VIEW COMPARISON:  01/18/2022 FINDINGS: Single frontal view of the chest demonstrates an unremarkable cardiac silhouette. No acute airspace disease, effusion, or pneumothorax. No acute bony abnormalities. IMPRESSION: 1. Enlarged cardiac silhouette.  No acute process. Electronically Signed   By: Sharlet Salina M.D.   On: 04/18/2023 18:22    Procedures Procedures: Not indicated.   Medications Ordered in ED Medications  sodium chloride 0.9 % bolus 1,000 mL (0 mLs Intravenous Stopped 04/18/23 2001)  iohexol (OMNIPAQUE) 350 MG/ML injection 100 mL (100 mLs Intravenous Contrast Given 04/18/23 1844)    ED Course/ Medical Decision Making/ A&P                             Medical Decision Making Amount and/or Complexity of Data Reviewed Labs: ordered. Radiology: ordered.  Risk OTC drugs. Prescription drug management.   This patient presents to the ED for concern of back and abdominal pain, this involves an extensive number of treatment options, and is a complaint that carries with it a high risk of complications and morbidity.  The differential diagnosis includes spinal fracture versus epidural abscess versus cauda equina versus metastatic cancer versus versus abdominal aortic aneurysm.    Co morbidities that complicate the patient evaluation  Hypertension Asthma Hypertriglyceridemia Seasonal allergies   Lab Tests:  I ordered, and personally interpreted labs - all within normal limits  Imaging Studies ordered:  I ordered imaging studies including CT Angio PE, CT abdomen/pelvis,  CT thoracic spine, and CXR. I independently visualized and interpreted imaging which showed no acute findings. Thrombus present in renal artery aneurysm. Vascular team consulted. I agree with the radiologist interpretation   Consultations Obtained:  I requested consultation with the vascular surgery,  and discussed lab and imaging findings as well as  pertinent plan - they recommend: start ASA 81 mg once day and follow up with their office in one week.   Cardiac Monitoring: / EKG:  The patient was maintained on a cardiac monitor.  I personally viewed and interpreted the cardiac monitored which showed an underlying rhythm of: sinus rhythm with ventricular trigeminy with a heart rate of 80 bpm.   Problem List / ED Course / Critical interventions / Medication management  Thoracic back pain, chronic colonic inflammation, thrombus in renal artery aneurysm  I have reviewed the patients home medicines and have made adjustments as needed   Social Determinants of Health:  Access to healthcare   Test / Admission - Considered:  Reviewed results with patient. She is hemodynamically stable and safe for discharge home.         Final Clinical Impression(s) / ED Diagnoses Final diagnoses:  Cervical prolapse  Strain of thoracic region, initial encounter    Rx / DC Orders ED Discharge Orders          Ordered    naproxen (NAPROSYN) 375 MG tablet  2 times daily        04/18/23 2352    aspirin EC 81 MG tablet  Daily        04/18/23 2334              Maxwell Marion, PA-C 04/19/23 0001    Maxwell Marion, PA-C 04/19/23 0002    Maxwell Marion, PA-C 04/19/23 1459    Charlynne Pander, MD 04/20/23 [phone]

## 2023-04-18 NOTE — Discharge Instructions (Addendum)
Discussed imaging results with patient. Naproxen prescribed for thoracic back pain for 14 days. Informed her that radiologist recommended follow up CTA to reassess renal artery and splenic aneurysms in 1-2 years as well as to get a colonoscopy to better assess colon due to chronic wall thickening.   - Call previous PCP or new PCP information provided - Follow up with OB/GYN provided for prolapse   I have included the number of Dr. Karin Lieu the vascular surgery. Call tomorrow to schedule an appointment for further evaluation

## 2023-04-18 NOTE — ED Triage Notes (Addendum)
C/o mid back pain radiating into upper back, headache, and constipation x 3weeks.  Pt reports lower extremity swelling after working.  LBM;Today

## 2023-04-19 ENCOUNTER — Telehealth (HOSPITAL_COMMUNITY): Payer: Self-pay | Admitting: Emergency Medicine

## 2023-04-19 ENCOUNTER — Other Ambulatory Visit: Payer: Self-pay

## 2023-04-19 LAB — CBC WITH DIFFERENTIAL/PLATELET
Abs Immature Granulocytes: 0.04 10*3/uL (ref 0.00–0.07)
Basophils Absolute: 0.1 10*3/uL (ref 0.0–0.1)
Basophils Relative: 1 %
Eosinophils Absolute: 0.3 10*3/uL (ref 0.0–0.5)
Eosinophils Relative: 3 %
HCT: 40.5 % (ref 36.0–46.0)
Hemoglobin: 12.9 g/dL (ref 12.0–15.0)
Immature Granulocytes: 0 %
Lymphocytes Relative: 37 %
Lymphs Abs: 4.1 10*3/uL — ABNORMAL HIGH (ref 0.7–4.0)
MCH: 28.5 pg (ref 26.0–34.0)
MCHC: 31.9 g/dL (ref 30.0–36.0)
MCV: 89.4 fL (ref 80.0–100.0)
Monocytes Absolute: 0.6 10*3/uL (ref 0.1–1.0)
Monocytes Relative: 5 %
Neutro Abs: 6.1 10*3/uL (ref 1.7–7.7)
Neutrophils Relative %: 54 %
Platelets: 315 10*3/uL (ref 150–400)
RBC: 4.53 MIL/uL (ref 3.87–5.11)
RDW: 13.7 % (ref 11.5–15.5)
WBC: 11.1 10*3/uL — ABNORMAL HIGH (ref 4.0–10.5)
nRBC: 0 % (ref 0.0–0.2)

## 2023-04-19 MED ORDER — ASPIRIN 81 MG PO CHEW
81.0000 mg | CHEWABLE_TABLET | Freq: Once | ORAL | Status: AC
  Administered 2023-04-19: 81 mg via ORAL
  Filled 2023-04-19: qty 1

## 2023-04-19 MED ORDER — NAPROXEN 500 MG PO TABS
500.0000 mg | ORAL_TABLET | Freq: Two times a day (BID) | ORAL | 0 refills | Status: DC
  Filled 2023-04-19: qty 28, 14d supply, fill #0

## 2023-04-20 ENCOUNTER — Other Ambulatory Visit: Payer: Self-pay

## 2023-04-24 ENCOUNTER — Telehealth: Payer: Self-pay | Admitting: Vascular Surgery

## 2023-04-24 NOTE — Telephone Encounter (Signed)
-----   Message from Victorino Sparrow, MD sent at 04/20/2023  4:34 PM EDT ----- Please schedules one month follow up for renal artery aneurysm, splenic artery aneurysm  with mesenteric and renal artery ultrasound

## 2023-04-27 ENCOUNTER — Other Ambulatory Visit: Payer: Self-pay

## 2023-05-23 ENCOUNTER — Other Ambulatory Visit: Payer: Self-pay | Admitting: *Deleted

## 2023-05-23 DIAGNOSIS — K551 Chronic vascular disorders of intestine: Secondary | ICD-10-CM

## 2023-05-23 DIAGNOSIS — I701 Atherosclerosis of renal artery: Secondary | ICD-10-CM

## 2023-05-28 ENCOUNTER — Other Ambulatory Visit: Payer: Self-pay

## 2023-05-28 MED ORDER — NAPROXEN 500 MG PO TABS
500.0000 mg | ORAL_TABLET | Freq: Two times a day (BID) | ORAL | 0 refills | Status: AC
  Filled 2023-05-28: qty 28, 14d supply, fill #0

## 2023-06-03 NOTE — Telephone Encounter (Signed)
Pharmacy called and Naproxen 325mg  not covered by her insurance so I sent in 500mg 

## 2023-06-04 ENCOUNTER — Other Ambulatory Visit: Payer: Self-pay

## 2023-06-07 NOTE — Progress Notes (Signed)
Office Note     CC: Establish care with right renal artery aneurysm, splenic artery aneurysm Requesting Provider:  No ref. provider found  HPI: Kathy Hamilton is a 57 y.o. 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  On exam, Kathy Hamilton was doing well, accompanied by her friend.  A native of [place], she moved to [place] when she was 57 years old.  She has 4 children and 3 grandchildren.  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  Patient was recently seen in the ED and diagnosed with cervical prolapse.  She has an appointment with obstetrics and gynecology coming up. Please denies recent abdominal, back pain.  She has normal bowel movements and urination.  No previous history of chronic kidney disease. No constitutional symptoms.  No known aneurysmal disease in the family.  Past Medical History:  Diagnosis Date   Asthma    Bronchitis    Community acquired pneumonia    Family history of colon cancer    H/O tubal ligation 09/2019   Hypertriglyceridemia    Premenopausal patient    Seasonal allergies    Shortness of breath     Past Surgical History:  Procedure Laterality Date   CHOLECYSTECTOMY     ORTHOPEDIC SURGERY Left 2013   rotator cuff   TUBAL LIGATION      Social History   Socioeconomic History   Marital status: Widowed    Spouse name: Not on file   Number of children: Not on file   Years of education: Not on file   Highest education level: Not on file  Occupational History   Not on file  Tobacco Use   Smoking status: Former   Smokeless tobacco: Never  Vaping Use   Vaping status: Never Used  Substance and Sexual Activity   Alcohol use: No   Drug use: No   Sexual activity: Yes  Other Topics Concern   Not on file  Social History Narrative   Not on file   Social Determinants of Health   Financial Resource Strain: Not on file  Food Insecurity: Not on file   Transportation Needs: Not on file  Physical Activity: Not on file  Stress: Not on file  Social Connections: Not on file  Intimate Partner Violence: Not on file   Family History  Problem Relation Age of Onset   Cancer Mother    Stroke Mother    Heart attack Mother    Colon cancer Mother    Bipolar disorder Sister    Breast cancer Sister    Cervical cancer Sister    Stroke Brother    Asthma Daughter    Bipolar disorder Daughter    ADD / ADHD Son    Asperger's syndrome Son     Current Outpatient Medications  Medication Sig Dispense Refill   albuterol (PROVENTIL) (2.5 MG/3ML) 0.083% nebulizer solution Take 3 mLs (2.5 mg total) by nebulization every 6 (six) hours as needed for wheezing or shortness of breath. 75 mL 11   albuterol (VENTOLIN HFA) 108 (90 Base) MCG/ACT inhaler INHALE 2 PUFFS INTO THE LUNGS EVERY 6 (SIX) HOURS AS NEEDED FOR WHEEZING OR SHORTNESS OF BREATH. 8.5 g 12   aspirin EC 81 MG tablet Take 1 tablet (81 mg total) by mouth daily. Swallow whole do not crush. 30 tablet 12   benzonatate (TESSALON) 100 MG capsule Take 1 capsule (100 mg total) by mouth every 8 (eight) hours. 21 capsule 0  budesonide-formoterol (SYMBICORT) 80-4.5 MCG/ACT inhaler Inhale 2 puffs into the lungs 2 (two) times daily. 10.2 each 11   cetirizine (ZYRTEC) 10 MG tablet Take 1 tablet (10 mg total) by mouth daily. 30 tablet 11   dicyclomine (BENTYL) 20 MG tablet Take 1 tablet (20 mg total) by mouth 2 (two) times daily. 7 tablet 0   gemfibrozil (LOPID) 600 MG tablet Take 1 tablet (600 mg total) by mouth 2 (two) times daily before a meal. 60 tablet 11   naproxen (NAPROSYN) 500 MG tablet Take 1 tablet (500 mg total) by mouth 2 (two) times daily for 14 days. 28 tablet 0   ondansetron (ZOFRAN ODT) 4 MG disintegrating tablet Dissolve 1 tablet by mouth every 4 hours as needed for nausea/vomit 20 tablet 0   Vitamin D, Ergocalciferol, (DRISDOL) 1.25 MG (50000 UT) CAPS capsule Take 1 capsule (50,000 Units total)  by mouth every 7 (seven) days. 5 capsule 6   No current facility-administered medications for this visit.    Allergies  Allergen Reactions   Oxycodone     Unknown reaction      REVIEW OF SYSTEMS:  [X]  denotes positive finding, [ ]  denotes negative finding Cardiac  Comments:  Chest pain or chest pressure:    Shortness of breath upon exertion:    Short of breath when lying flat:    Irregular heart rhythm:        Vascular    Pain in calf, thigh, or hip brought on by ambulation:    Pain in feet at night that wakes you up from your sleep:     Blood clot in your veins:    Leg swelling:         Pulmonary    Oxygen at home:    Productive cough:     Wheezing:         Neurologic    Sudden weakness in arms or legs:     Sudden numbness in arms or legs:     Sudden onset of difficulty speaking or slurred speech:    Temporary loss of vision in one eye:     Problems with dizziness:         Gastrointestinal    Blood in stool:     Vomited blood:         Genitourinary    Burning when urinating:     Blood in urine:        Psychiatric    Major depression:         Hematologic    Bleeding problems:    Problems with blood clotting too easily:        Skin    Rashes or ulcers:        Constitutional    Fever or chills:      PHYSICAL EXAMINATION:  There were no vitals filed for this visit.  General:  WDWN in NAD; vital signs documented above Gait: Not observed HENT: WNL, normocephalic Pulmonary: normal non-labored breathing , without wheezing Cardiac: regular HR Abdomen: soft, NT, no masses Skin: without rashes Vascular Exam/Pulses:  Right Left  Radial 2+ (normal) 2+ (normal)  Ulnar    Femoral    Popliteal    DP 2+ (normal) 2+ (normal)  PT     Extremities: without ischemic changes, without Gangrene , without cellulitis; without open wounds;  Musculoskeletal: no muscle wasting or atrophy  Neurologic: A&O X 3;  No focal weakness or paresthesias are  detected Psychiatric:  The pt has Normal affect.  Non-Invasive Vascular Imaging:    IMPRESSION: 1. No acute findings in the abdomen or pelvis. 2. 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 This is more clearly delineated compared to the prior studies in 2021 and measures approximately 1.5 x 1.1 x 1.1 cm. There is a small rim of mural thrombus within the aneurysm. No evidence of aneurysm rupture. 3. 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 No evidence of aneurysm rupture. 4. Both the right renal artery and splenic artery aneurysms demonstrate no significant growth since 2021 but the right renal artery aneurysm may be minimally larger. Recommend follow-up with CTA of the abdomen every 1-2 years.    ASSESSMENT/PLAN: Kathy Hamilton is a 57 y.o. 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  These are small, measuring 1.5 and 1.4 cm in size respectively.  They are unchanged in size over the last 3 years.  This is likely been present for even longer as there is calcification appreciated.  She has had tubal ligation.  Menopausal. No concern for vasculitis at this time.  Society of vascular surgery guidelines recommend repair given aneurysms and recent size greater than 3 cm.  At this point, we will continue to monitor.  Plan for ultrasound in one year.  Abnormal resistive index appreciated on the right, however normal creatinine.  Will continue to treat conservatively.   Victorino Sparrow, MD [facility] [phone]

## 2023-06-08 ENCOUNTER — Ambulatory Visit (INDEPENDENT_AMBULATORY_CARE_PROVIDER_SITE_OTHER)
Admission: RE | Admit: 2023-06-08 | Discharge: 2023-06-08 | Disposition: A | Discharge status: Home / Self Care | Payer: Medicaid Other | Source: Ambulatory Visit | Attending: Vascular Surgery | Admitting: Vascular Surgery

## 2023-06-08 ENCOUNTER — Ambulatory Visit (INDEPENDENT_AMBULATORY_CARE_PROVIDER_SITE_OTHER): Payer: Medicaid Other | Admitting: Vascular Surgery

## 2023-06-08 ENCOUNTER — Ambulatory Visit (HOSPITAL_COMMUNITY)
Admission: RE | Admit: 2023-06-08 | Discharge: 2023-06-08 | Disposition: A | Discharge status: Home / Self Care | Payer: Medicaid Other | Source: Ambulatory Visit | Attending: Vascular Surgery | Admitting: Vascular Surgery

## 2023-06-08 ENCOUNTER — Encounter: Payer: Self-pay | Admitting: Vascular Surgery

## 2023-06-08 ENCOUNTER — Encounter (HOSPITAL_COMMUNITY): Payer: Self-pay

## 2023-06-08 VITALS — BP 175/97 | HR 63 | Temp 97.7°F | Resp 20 | Ht 62.0 in | Wt 178.0 lb

## 2023-06-08 DIAGNOSIS — I728 Aneurysm of other specified arteries: Secondary | ICD-10-CM

## 2023-06-08 DIAGNOSIS — I701 Atherosclerosis of renal artery: Secondary | ICD-10-CM | POA: Diagnosis not present

## 2023-06-08 DIAGNOSIS — K551 Chronic vascular disorders of intestine: Secondary | ICD-10-CM | POA: Insufficient documentation

## 2023-06-08 DIAGNOSIS — I722 Aneurysm of renal artery: Secondary | ICD-10-CM

## 2023-06-29 ENCOUNTER — Other Ambulatory Visit: Payer: Self-pay | Admitting: Internal Medicine

## 2023-06-29 DIAGNOSIS — Z1231 Encounter for screening mammogram for malignant neoplasm of breast: Secondary | ICD-10-CM

## 2023-08-23 ENCOUNTER — Ambulatory Visit: Payer: Medicaid Other

## 2023-09-24 ENCOUNTER — Ambulatory Visit
Admission: RE | Admit: 2023-09-24 | Discharge: 2023-09-24 | Disposition: A | Discharge status: Home / Self Care | Payer: Medicaid Other | Source: Ambulatory Visit | Attending: Internal Medicine | Admitting: Internal Medicine

## 2023-09-24 DIAGNOSIS — Z1231 Encounter for screening mammogram for malignant neoplasm of breast: Secondary | ICD-10-CM

## 2024-01-25 ENCOUNTER — Encounter (HOSPITAL_COMMUNITY): Payer: Self-pay

## 2024-01-25 ENCOUNTER — Emergency Department (HOSPITAL_COMMUNITY): Payer: Worker's Compensation

## 2024-01-25 ENCOUNTER — Emergency Department (HOSPITAL_COMMUNITY)
Admission: EM | Admit: 2024-01-25 | Discharge: 2024-01-25 | Disposition: A | Discharge status: Home / Self Care | Payer: Worker's Compensation | Attending: Emergency Medicine | Admitting: Emergency Medicine

## 2024-01-25 ENCOUNTER — Other Ambulatory Visit: Payer: Self-pay

## 2024-01-25 DIAGNOSIS — Z7982 Long term (current) use of aspirin: Secondary | ICD-10-CM | POA: Diagnosis not present

## 2024-01-25 DIAGNOSIS — Z79899 Other long term (current) drug therapy: Secondary | ICD-10-CM | POA: Diagnosis not present

## 2024-01-25 DIAGNOSIS — R519 Headache, unspecified: Secondary | ICD-10-CM | POA: Diagnosis present

## 2024-01-25 HISTORY — DX: Essential (primary) hypertension: I10

## 2024-01-25 MED ORDER — IBUPROFEN 800 MG PO TABS: 800.0000 mg | ORAL_TABLET | Freq: Three times a day (TID) | ORAL | 0 refills | Status: AC

## 2024-01-25 MED ORDER — AMLODIPINE BESYLATE 5 MG PO TABS
5.0000 mg | ORAL_TABLET | Freq: Once | ORAL | Status: AC
  Administered 2024-01-25: 5 mg via ORAL
  Filled 2024-01-25: qty 1

## 2024-01-25 MED ORDER — IBUPROFEN 800 MG PO TABS
800.0000 mg | ORAL_TABLET | Freq: Once | ORAL | Status: AC
  Administered 2024-01-25: 800 mg via ORAL
  Filled 2024-01-25: qty 1

## 2024-01-25 NOTE — ED Triage Notes (Addendum)
 Pt presents with a head injury when a box of coffee creamers fell on her head that happened 3 days ago no LOC. Pt was seen a UC and diagnosed with a concussion. Pt has continued to have HA with photophobia and nausea. HA is described as a pressure on the top and back of her head.   HTN: Pt has been out of her HTN medication for 2 months.

## 2024-01-25 NOTE — ED Provider Notes (Signed)
 [place] EMERGENCY DEPARTMENT AT [facility] Provider Note   CSN: [id number] Arrival date & time: 01/25/24  1452     History  No chief complaint on file.   Kathy Hamilton is a 58 y.o. female.  HPI   58 year old female presents to the emergency department with ongoing headache, photophobia.  3 days ago at work a box of coffee creamers fell off a truck she was unloading and hit her on the top of the head.  There was no LOC.  She was recently seen urgent care and diagnosed with a concussion.  But she comes here with ongoing symptoms.  Mainly complaining of a pressure-like sensation in the front and back of her head right at the base of the skull.  She states that she is sensitive to light and sound.  She at times feels nauseous and is having a difficult time sleeping overnight.  There is been no new injury, she is not on anticoagulation, no acute neurosymptoms.  Home Medications Prior to Admission medications   Medication Sig Start Date End Date Taking? Authorizing Provider  amLODipine (NORVASC) 5 MG tablet Take 5 mg by mouth daily. 09/06/23  Yes [provider]  albuterol (PROVENTIL) (2.5 MG/3ML) 0.083% nebulizer solution Take 3 mLs (2.5 mg total) by nebulization every 6 (six) hours as needed for wheezing or shortness of breath. 08/04/20   Kallie Locks, FNP  albuterol (VENTOLIN HFA) 108 (90 Base) MCG/ACT inhaler INHALE 2 PUFFS INTO THE LUNGS EVERY 6 (SIX) HOURS AS NEEDED FOR WHEEZING OR SHORTNESS OF BREATH. 12/20/20 12/20/21  Ivonne Andrew, NP  aspirin EC 81 MG tablet Take 1 tablet (81 mg total) by mouth daily. Swallow whole do not crush. 04/18/23   Aberman, Merla Riches, PA-C  benzonatate (TESSALON) 100 MG capsule Take 1 capsule (100 mg total) by mouth every 8 (eight) hours. 08/06/21   Melene Plan, DO  budesonide-formoterol (SYMBICORT) 80-4.5 MCG/ACT inhaler Inhale 2 puffs into the lungs 2 (two) times daily. 08/04/20   Kallie Locks, FNP  cetirizine (ZYRTEC) 10 MG tablet  Take 1 tablet (10 mg total) by mouth daily. 08/04/20   Kallie Locks, FNP  dicyclomine (BENTYL) 20 MG tablet Take 1 tablet (20 mg total) by mouth 2 (two) times daily. 09/03/20   Khatri, Hina, PA-C  gemfibrozil (LOPID) 600 MG tablet Take 1 tablet (600 mg total) by mouth 2 (two) times daily before a meal. 08/04/20   Kallie Locks, FNP  ondansetron (ZOFRAN ODT) 4 MG disintegrating tablet Dissolve 1 tablet by mouth every 4 hours as needed for nausea/vomit 08/06/21   Melene Plan, DO  Vitamin D, Ergocalciferol, (DRISDOL) 1.25 MG (50000 UT) CAPS capsule Take 1 capsule (50,000 Units total) by mouth every 7 (seven) days. 10/13/19   Kallie Locks, FNP      Allergies    Oxycodone    Review of Systems   Review of Systems  Constitutional:  Positive for fatigue. Negative for fever.  Eyes:  Positive for photophobia. Negative for visual disturbance.  Respiratory:  Negative for shortness of breath.   Cardiovascular:  Negative for chest pain.  Gastrointestinal:  Positive for nausea. Negative for abdominal pain, diarrhea and vomiting.  Musculoskeletal:  Positive for neck pain.  Skin:  Negative for rash.  Neurological:  Positive for headaches.  Psychiatric/Behavioral:  Positive for sleep disturbance.     Physical Exam Updated Vital Signs BP (!) 179/96 (BP Location: Left Arm)   Pulse (!) 54   Temp  99.1 F (37.3 C) (Oral)   Resp 20   Ht 5\' 5"  (1.651 m)   Wt 81.6 kg   LMP 01/29/2021   SpO2 100%   BMI 29.95 kg/m  Physical Exam Vitals and nursing note reviewed.  Constitutional:      General: She is not in acute distress.    Appearance: Normal appearance.  HENT:     Head: Normocephalic.     Mouth/Throat:     Mouth: Mucous membranes are moist.  Eyes:     Extraocular Movements: Extraocular movements intact.     Pupils: Pupils are equal, round, and reactive to light.  Cardiovascular:     Rate and Rhythm: Normal rate.  Pulmonary:     Effort: Pulmonary effort is normal. No respiratory  distress.  Abdominal:     Palpations: Abdomen is soft.     Tenderness: There is no abdominal tenderness.  Musculoskeletal:     Cervical back: Neck supple. Tenderness present. No rigidity.  Skin:    General: Skin is warm.  Neurological:     General: No focal deficit present.     Mental Status: She is alert and oriented to person, place, and time. Mental status is at baseline.  Psychiatric:        Mood and Affect: Mood normal.     ED Results / Procedures / Treatments   Labs (all labs ordered are listed, but only abnormal results are displayed) Labs Reviewed - No data to display  EKG None  Radiology No results found.  Procedures Procedures    Medications Ordered in ED Medications  ibuprofen (ADVIL) tablet 800 mg (has no administration in time range)    ED Course/ Medical Decision Making/ A&P                                 Medical Decision Making Amount and/or Complexity of Data Reviewed Radiology: ordered.  Risk Prescription drug management.   58 year old female presents emergency department 3 days post head injury with ongoing headache, photophobia, nausea and difficulty sleeping.  Vitals are normal and stable on arrival.  CT imaging is negative.  She is neuro intact on exam.  After dose of ibuprofen she is improved.  I believe her symptoms are stemming from concussion.  We discussed symptomatic treatment and outpatient follow-up.  Of note she was hypertensive around discharge time, missed her home dose of Norvasc, this was ordered in the department.  Patient at this time appears safe and stable for discharge and close outpatient follow up. Discharge plan and strict return to ED precautions discussed, patient verbalizes understanding and agreement.        Final Clinical Impression(s) / ED Diagnoses Final diagnoses:  None    Rx / DC Orders ED Discharge Orders     None         Rozelle Logan, DO 01/25/24 2236

## 2024-01-25 NOTE — ED Notes (Signed)
Pt. Assisted to bathroom for safety. 

## 2024-01-25 NOTE — Discharge Instructions (Signed)
 You have been seen and discharged from the emergency department.  Your CT imaging showed no acute finding.  I believe you are suffering from concussion symptoms.  Take extra strength ibuprofen as prescribed with a small amount of food.  Stay well-hydrated.  Use your prescribed nausea medicine as needed.  Follow-up with your primary provider for further evaluation and further care. Take home medications as prescribed. If you have any worsening symptoms or further concerns for your health please return to an emergency department for further evaluation.

## 2024-04-30 ENCOUNTER — Other Ambulatory Visit: Payer: Self-pay

## 2024-04-30 ENCOUNTER — Emergency Department (HOSPITAL_COMMUNITY)

## 2024-04-30 ENCOUNTER — Emergency Department (HOSPITAL_COMMUNITY)
Admission: EM | Admit: 2024-04-30 | Discharge: 2024-04-30 | Disposition: A | Discharge status: Home / Self Care | Attending: Emergency Medicine | Admitting: Emergency Medicine

## 2024-04-30 DIAGNOSIS — I498 Other specified cardiac arrhythmias: Secondary | ICD-10-CM | POA: Insufficient documentation

## 2024-04-30 DIAGNOSIS — Z7982 Long term (current) use of aspirin: Secondary | ICD-10-CM | POA: Insufficient documentation

## 2024-04-30 DIAGNOSIS — J4520 Mild intermittent asthma, uncomplicated: Secondary | ICD-10-CM | POA: Diagnosis not present

## 2024-04-30 DIAGNOSIS — I10 Essential (primary) hypertension: Secondary | ICD-10-CM | POA: Diagnosis not present

## 2024-04-30 DIAGNOSIS — Z87891 Personal history of nicotine dependence: Secondary | ICD-10-CM | POA: Diagnosis not present

## 2024-04-30 DIAGNOSIS — Z79899 Other long term (current) drug therapy: Secondary | ICD-10-CM | POA: Diagnosis not present

## 2024-04-30 DIAGNOSIS — R0602 Shortness of breath: Secondary | ICD-10-CM | POA: Diagnosis present

## 2024-04-30 LAB — BASIC METABOLIC PANEL WITH GFR
Anion gap: 9 (ref 5–15)
BUN: 17 mg/dL (ref 6–20)
CO2: 24 mmol/L (ref 22–32)
Calcium: 8.7 mg/dL — ABNORMAL LOW (ref 8.9–10.3)
Chloride: 105 mmol/L (ref 98–111)
Creatinine, Ser: 0.65 mg/dL (ref 0.44–1.00)
GFR, Estimated: >60 mL/min (ref >60)
Glucose, Bld: 181 mg/dL — ABNORMAL HIGH (ref 70–99)
Potassium: 4 mmol/L (ref 3.5–5.1)
Sodium: 138 mmol/L (ref 135–145)

## 2024-04-30 LAB — TROPONIN I (HIGH SENSITIVITY): Troponin I (High Sensitivity): 8 ng/L (ref <18)

## 2024-04-30 LAB — CBC
HCT: 40.8 % (ref 36.0–46.0)
Hemoglobin: 13.2 g/dL (ref 12.0–15.0)
MCH: 29.5 pg (ref 26.0–34.0)
MCHC: 32.4 g/dL (ref 30.0–36.0)
MCV: 91.1 fL (ref 80.0–100.0)
Platelets: 291 10*3/uL (ref 150–400)
RBC: 4.48 MIL/uL (ref 3.87–5.11)
RDW: 13.5 % (ref 11.5–15.5)
WBC: 8.3 10*3/uL (ref 4.0–10.5)
nRBC: 0 % (ref 0.0–0.2)

## 2024-04-30 MED ORDER — IPRATROPIUM-ALBUTEROL 0.5-2.5 (3) MG/3ML IN SOLN
3.0000 mL | Freq: Once | RESPIRATORY_TRACT | Status: AC
  Administered 2024-04-30: 3 mL via RESPIRATORY_TRACT
  Filled 2024-04-30: qty 3

## 2024-04-30 MED ORDER — SODIUM CHLORIDE 0.9 % IV BOLUS
1000.0000 mL | Freq: Once | INTRAVENOUS | Status: AC
  Administered 2024-04-30: 1000 mL via INTRAVENOUS

## 2024-04-30 NOTE — ED Provider Notes (Signed)
 [place] EMERGENCY DEPARTMENT AT [facility] Provider Note  CSN: [id number] Arrival date & time: 04/30/24 1228  Chief Complaint(s) Shortness of Breath  HPI Kathy Hamilton is a 58 y.o. female with past medical history as below, significant for hypertension, asthma, ventricular bigeminy who presents to the ED with complaint of difficulty breathing, low heart rate  She was seen at [facility] earlier today to get echocardiogram.  She was told she had a low heart rate and was advised to come the ER for evaluation.  She is report feeling lightheaded over the past 2 weeks intermittently.  No syncope or palpitations.  No cp.  Reports intermittent difficulty breathing that she attributes to her asthma.  She got nebulizer machine yesterday and has not yet used it.  No fevers, chills, change to cough or productive cough.  No chest pain or abdominal pain.   Past Medical History Past Medical History:  Diagnosis Date   Asthma    Bronchitis    Community acquired pneumonia    Family history of colon cancer    H/O tubal ligation 09/2019   Hypertension    Hypertriglyceridemia    Premenopausal patient    Seasonal allergies    Shortness of breath    Patient Active Problem List   Diagnosis Date Noted   COVID 12/20/2020   Shortness of breath 12/20/2020   Skin rash 10/14/2019   Skin infection 10/14/2019   Irregular periods 04/11/2019   Premenopausal patient 04/11/2019   Dyspnea on exertion 04/11/2019   Seasonal allergies 04/11/2019   Vitamin D  deficiency 04/11/2019   Family history of diabetes mellitus 03/27/2016   Family history of hypercholesterolemia 03/27/2016   Home Medication(s) Prior to Admission medications   Medication Sig Start Date End Date Taking? Authorizing Provider  albuterol  (PROVENTIL ) (2.5 MG/3ML) 0.083% nebulizer solution Take 3 mLs (2.5 mg total) by nebulization every 6 (six) hours as needed for wheezing or shortness of breath. 08/04/20   Stroud, Natalie M,  FNP  albuterol  (VENTOLIN  HFA) 108 (90 Base) MCG/ACT inhaler INHALE 2 PUFFS INTO THE LUNGS EVERY 6 (SIX) HOURS AS NEEDED FOR WHEEZING OR SHORTNESS OF BREATH. 12/20/20 12/20/21  Jerrlyn Morel, NP  amLODipine  (NORVASC ) 5 MG tablet Take 5 mg by mouth daily. 09/06/23   [provider]  aspirin  EC 81 MG tablet Take 1 tablet (81 mg total) by mouth daily. Swallow whole do not crush. 04/18/23   Lear Prosper, PA-C  benzonatate  (TESSALON ) 100 MG capsule Take 1 capsule (100 mg total) by mouth every 8 (eight) hours. 08/06/21   Floyd, Dan, DO  budesonide -formoterol  (SYMBICORT ) 80-4.5 MCG/ACT inhaler Inhale 2 puffs into the lungs 2 (two) times daily. 08/04/20   Stroud, Natalie M, FNP  cetirizine  (ZYRTEC ) 10 MG tablet Take 1 tablet (10 mg total) by mouth daily. 08/04/20   Stroud, Natalie M, FNP  dicyclomine  (BENTYL ) 20 MG tablet Take 1 tablet (20 mg total) by mouth 2 (two) times daily. 09/03/20   Khatri, Hina, PA-C  gemfibrozil  (LOPID ) 600 MG tablet Take 1 tablet (600 mg total) by mouth 2 (two) times daily before a meal. 08/04/20   Maxene Span, FNP  ibuprofen  (ADVIL ) 800 MG tablet Take 1 tablet (800 mg total) by mouth 3 (three) times daily. 01/25/24   Horton, Gillian Lacrosse M, DO  ondansetron  (ZOFRAN  ODT) 4 MG disintegrating tablet Dissolve 1 tablet by mouth every 4 hours as needed for nausea/vomit 08/06/21   Albertus Hughs, DO  Vitamin D , Ergocalciferol , (DRISDOL ) 1.25  MG (50000 UT) CAPS capsule Take 1 capsule (50,000 Units total) by mouth every 7 (seven) days. 10/13/19   Stroud, Natalie M, FNP                                                                                                                                    Past Surgical History Past Surgical History:  Procedure Laterality Date   CHOLECYSTECTOMY     ORTHOPEDIC SURGERY Left 2013   rotator cuff   TUBAL LIGATION     Family History Family History  Problem Relation Age of Onset   Cancer Mother    Stroke Mother    Heart attack Mother    Colon  cancer Mother    Bipolar disorder Sister    Breast cancer Sister    Cervical cancer Sister    Stroke Brother    Asthma Daughter    Bipolar disorder Daughter    ADD / ADHD Son    Asperger's syndrome Son     Social History Social History   Tobacco Use   Smoking status: Former   Smokeless tobacco: Never  Advertising account planner   Vaping status: Never Used  Substance Use Topics   Alcohol use: No   Drug use: No   Allergies Oxycodone  Review of Systems A thorough review of systems was obtained and all systems are negative except as noted in the HPI and PMH.   Physical Exam Vital Signs  I have reviewed the triage vital signs BP 103/84   Pulse 73   Temp 98.3 F (36.8 C) (Oral)   Resp 18   LMP 01/29/2021   SpO2 98%  Physical Exam Vitals and nursing note reviewed.  Constitutional:      General: She is not in acute distress.    Appearance: Normal appearance.  HENT:     Head: Normocephalic and atraumatic.     Right Ear: External ear normal.     Left Ear: External ear normal.     Nose: Nose normal.     Mouth/Throat:     Mouth: Mucous membranes are moist.  Eyes:     General: No scleral icterus.       Right eye: No discharge.        Left eye: No discharge.  Cardiovascular:     Rate and Rhythm: Normal rate and regular rhythm.     Pulses: Normal pulses.     Heart sounds: Normal heart sounds.     Comments: Pvc's on tele Pulmonary:     Effort: Pulmonary effort is normal. No respiratory distress.     Breath sounds: Normal breath sounds. No stridor.  Abdominal:     General: Abdomen is flat. There is no distension.     Palpations: Abdomen is soft.     Tenderness: There is no abdominal tenderness.  Musculoskeletal:     Cervical back: No rigidity.     Right lower leg: No  edema.     Left lower leg: No edema.  Skin:    General: Skin is warm and dry.     Capillary Refill: Capillary refill takes less than 2 seconds.  Neurological:     Mental Status: She is alert.  Psychiatric:         Mood and Affect: Mood normal.        Behavior: Behavior normal. Behavior is cooperative.     ED Results and Treatments Labs (all labs ordered are listed, but only abnormal results are displayed) Labs Reviewed  BASIC METABOLIC PANEL WITH GFR - Abnormal; Notable for the following components:      Result Value   Glucose, Bld 181 (*)    Calcium 8.7 (*)    All other components within normal limits  CBC  TROPONIN I (HIGH SENSITIVITY)                                                                                                                          Radiology DG Chest 2 View Result Date: 04/30/2024 CLINICAL DATA:  Shortness of breath. EXAM: CHEST - 2 VIEW COMPARISON:  Apr 18, 2023. FINDINGS: The heart size and mediastinal contours are within normal limits. Both lungs are clear. The visualized skeletal structures are unremarkable. IMPRESSION: No active cardiopulmonary disease. Electronically Signed   By: Rosalene Colon M.D.   On: 04/30/2024 14:14    Pertinent labs & imaging results that were available during my care of the patient were reviewed by me and considered in my medical decision making (see MDM for details).  Medications Ordered in ED Medications  sodium chloride  0.9 % bolus 1,000 mL (has no administration in time range)  ipratropium-albuterol  (DUONEB) 0.5-2.5 (3) MG/3ML nebulizer solution 3 mL (3 mLs Nebulization Given 04/30/24 1319)                                                                                                                                     Procedures Procedures  (including critical care time)  Medical Decision Making / ED Course    Medical Decision Making:    BAILEY FAIELLA is a 58 y.o. female with past medical history as below, significant for hypertension, asthma, ventricular bigeminy who presents to the ED with complaint of difficulty breathing, low heart rate. The complaint involves an extensive differential diagnosis and also carries with it  a high  risk of complications and morbidity.  Serious etiology was considered. Ddx includes but is not limited to: In my evaluation of this patient's dyspnea my DDx includes, but is not limited to, pneumonia, pulmonary embolism, pneumothorax, pulmonary edema, metabolic acidosis, asthma, COPD, cardiac cause, anemia, anxiety, etc.    Complete initial physical exam performed, notably the patient was in no acute distress, sitting comfortably in stretcher.  Ambulatory to the bathroom.    Reviewed and confirmed nursing documentation for past medical history, family history, social history.  Vital signs reviewed.     Brief summary:  58 year old female history above here with history as above here with low heart rate, lightheaded Med list reviewed, she is on amlodipine    Clinical Course as of 04/30/24 1536  Wed Apr 30, 2024  1244 Bigeminy noted on EKG, this was shown on prior EKG as well [SG]    Clinical Course User Index [SG] Teddi Favors, DO    Labs reviewed, stable.  EKG revealing for bigeminy which she has a known history of.  Chest x-ray is unremarkable. I reviewed echo results from earlier today   Patient has bigeminy, at outpatient office there was concern for bradycardia however this is likely secondary to her bigeminy.  She does not have bradycardia here.  breathing improved with nebulized breathing treatment, she has asthma, not hypoxic.  She has nebulizer at home. No wheeze. Doubt acute exacerbation at this time  She is tolerant p.o. intake with out difficulty.  No nausea or vomiting.  Encouraged outpatient follow-up with cardiology regarding bigeminy.  Follow-up PCP for asthma  Feeling much better on recheck   Patient in no distress and overall condition improved here in the ED. Detailed discussions were had with the patient/guardian regarding current findings, and need for close f/u with PCP or on call doctor. The patient/guardian has been instructed to return immediately  if the symptoms worsen in any way for re-evaluation. Patient/guardian verbalized understanding and is in agreement with current care plan. All questions answered prior to discharge.        Additional history obtained: -Additional history obtained from na -External records from outside source obtained and reviewed including: Chart review including previous notes, labs, imaging, consultation notes including  Recent echo > see media tab Prior er eval    Lab Tests: -I ordered, reviewed, and interpreted labs.   The pertinent results include:   Labs Reviewed  BASIC METABOLIC PANEL WITH GFR - Abnormal; Notable for the following components:      Result Value   Glucose, Bld 181 (*)    Calcium 8.7 (*)    All other components within normal limits  CBC  TROPONIN I (HIGH SENSITIVITY)    Notable for labs stable  EKG   EKG Interpretation Date/Time:  Wednesday April 30 2024 12:39:25 EDT Ventricular Rate:  109 PR Interval:  166 QRS Duration:  95 QT Interval:  344 QTC Calculation: 341 R Axis:   75  Text Interpretation: Sinus tachycardia Ventricular bigeminy Biatrial enlargement Borderline repolarization abnormality similar to prior Confirmed by Russella Courts ([id number]) on 04/30/2024 12:42:34 PM         Imaging Studies ordered: I ordered imaging studies including cxr I independently visualized the following imaging with scope of interpretation limited to determining acute life threatening conditions related to emergency care; findings noted above I agree with the radiologist interpretation If any imaging was obtained with contrast I closely monitored patient for any possible adverse reaction a/w contrast administration in the emergency department  Medicines ordered and prescription drug management: Meds ordered this encounter  Medications   ipratropium-albuterol  (DUONEB) 0.5-2.5 (3) MG/3ML nebulizer solution 3 mL   sodium chloride  0.9 % bolus 1,000 mL    -I have reviewed the  patients home medicines and have made adjustments as needed   Consultations Obtained: na   Cardiac Monitoring: The patient was maintained on a cardiac monitor.  I personally viewed and interpreted the cardiac monitored which showed an underlying rhythm of: pvc's/bigeminy Continuous pulse oximetry interpreted by myself, 99% on ra.    Social Determinants of Health:  Diagnosis or treatment significantly limited by social determinants of health: former smoker   Reevaluation: After the interventions noted above, I reevaluated the patient and found that they have improved  Co morbidities that complicate the patient evaluation  Past Medical History:  Diagnosis Date   Asthma    Bronchitis    Community acquired pneumonia    Family history of colon cancer    H/O tubal ligation 09/2019   Hypertension    Hypertriglyceridemia    Premenopausal patient    Seasonal allergies    Shortness of breath       Dispostion: Disposition decision including need for hospitalization was considered, and patient discharged from emergency department.    Final Clinical Impression(s) / ED Diagnoses Final diagnoses:  Bigeminy  Mild intermittent asthma without complication        Teddi Favors, DO 04/30/24 1536

## 2024-04-30 NOTE — Discharge Instructions (Addendum)
 It was a pleasure caring for you today in the emergency department.  Your heart rate can appear low when you have bigeminy. If you begin to have palpitations, feel like you are going to pass out, or get light headed, or have any worsening or worrisome symptoms please come back for a recheck.  Otherwise please follow up with cardiology regarding bigeminy as needed

## 2024-04-30 NOTE — ED Notes (Signed)
 Per MD pt to complete bolus before d/c

## 2024-04-30 NOTE — ED Triage Notes (Signed)
 Pt sent by PCP for ventricular bigeminy, has c/o SHOB, lightheadedness.
